# Patient Record
Sex: Female | Born: 1935
Health system: Southern US, Community
[De-identification: ages and names within clinical notes are randomized; demographics above are authoritative.]

## PROBLEM LIST (undated history)

## (undated) DIAGNOSIS — G629 Polyneuropathy, unspecified: Secondary | ICD-10-CM

## (undated) DIAGNOSIS — I350 Nonrheumatic aortic (valve) stenosis: Secondary | ICD-10-CM

## (undated) DIAGNOSIS — E079 Disorder of thyroid, unspecified: Secondary | ICD-10-CM

## (undated) DIAGNOSIS — E785 Hyperlipidemia, unspecified: Secondary | ICD-10-CM

## (undated) DIAGNOSIS — G459 Transient cerebral ischemic attack, unspecified: Secondary | ICD-10-CM

## (undated) DIAGNOSIS — I10 Essential (primary) hypertension: Secondary | ICD-10-CM

## (undated) DIAGNOSIS — I2510 Atherosclerotic heart disease of native coronary artery without angina pectoris: Secondary | ICD-10-CM

## (undated) DIAGNOSIS — M1990 Unspecified osteoarthritis, unspecified site: Secondary | ICD-10-CM

## (undated) HISTORY — PX: ARTERIAL BYPASS SURGRY: SHX557

## (undated) HISTORY — DX: Nonrheumatic aortic (valve) stenosis: I35.0

## (undated) HISTORY — DX: Disorder of thyroid, unspecified: E07.9

## (undated) HISTORY — PX: LIPOMA EXCISION: SHX5283

## (undated) HISTORY — PX: BACK SURGERY: SHX140

## (undated) HISTORY — DX: Essential (primary) hypertension: I10

## (undated) HISTORY — DX: Hyperlipidemia, unspecified: E78.5

## (undated) HISTORY — PX: UPPER GASTROINTESTINAL ENDOSCOPY: SHX188

## (undated) HISTORY — PX: SKULL FRACTURE ELEVATION: SHX781

## (undated) HISTORY — PX: APPENDECTOMY: SHX54

## (undated) HISTORY — DX: Unspecified osteoarthritis, unspecified site: M19.90

## (undated) HISTORY — DX: Transient cerebral ischemic attack, unspecified: G45.9

## (undated) HISTORY — DX: Polyneuropathy, unspecified: G62.9

## (undated) HISTORY — PX: COLONOSCOPY: SHX174

## (undated) HISTORY — DX: Atherosclerotic heart disease of native coronary artery without angina pectoris: I25.10

---

## 1998-01-27 ENCOUNTER — Other Ambulatory Visit: Admission: RE | Admit: 1998-01-27 | Discharge: 1998-01-27 | Payer: Self-pay | Admitting: Internal Medicine

## 1999-01-28 ENCOUNTER — Other Ambulatory Visit: Admission: RE | Admit: 1999-01-28 | Discharge: 1999-01-28 | Payer: Self-pay | Admitting: Internal Medicine

## 1999-03-19 ENCOUNTER — Ambulatory Visit (HOSPITAL_COMMUNITY): Admission: RE | Admit: 1999-03-19 | Discharge: 1999-03-19 | Payer: Self-pay | Admitting: Gastroenterology

## 1999-04-20 ENCOUNTER — Encounter: Payer: Self-pay | Admitting: Surgery

## 1999-04-22 ENCOUNTER — Encounter: Payer: Self-pay | Admitting: Surgery

## 1999-04-22 ENCOUNTER — Inpatient Hospital Stay (HOSPITAL_COMMUNITY): Admission: RE | Admit: 1999-04-22 | Discharge: 1999-04-28 | Payer: Self-pay | Admitting: Surgery

## 1999-09-14 ENCOUNTER — Encounter: Admission: RE | Admit: 1999-09-14 | Discharge: 1999-09-14 | Payer: Self-pay | Admitting: Internal Medicine

## 1999-09-14 ENCOUNTER — Encounter: Payer: Self-pay | Admitting: Internal Medicine

## 2000-02-10 ENCOUNTER — Other Ambulatory Visit: Admission: RE | Admit: 2000-02-10 | Discharge: 2000-02-10 | Payer: Self-pay | Admitting: Internal Medicine

## 2000-02-12 ENCOUNTER — Encounter: Payer: Self-pay | Admitting: Internal Medicine

## 2000-02-12 ENCOUNTER — Encounter: Admission: RE | Admit: 2000-02-12 | Discharge: 2000-02-12 | Payer: Self-pay | Admitting: Internal Medicine

## 2000-09-14 ENCOUNTER — Encounter: Payer: Self-pay | Admitting: Internal Medicine

## 2000-09-14 ENCOUNTER — Encounter: Admission: RE | Admit: 2000-09-14 | Discharge: 2000-09-14 | Payer: Self-pay | Admitting: Internal Medicine

## 2001-02-27 ENCOUNTER — Other Ambulatory Visit: Admission: RE | Admit: 2001-02-27 | Discharge: 2001-02-27 | Payer: Self-pay | Admitting: Internal Medicine

## 2001-03-07 ENCOUNTER — Encounter: Admission: RE | Admit: 2001-03-07 | Discharge: 2001-03-07 | Payer: Self-pay | Admitting: Internal Medicine

## 2001-03-07 ENCOUNTER — Encounter: Payer: Self-pay | Admitting: Internal Medicine

## 2001-09-19 ENCOUNTER — Encounter: Payer: Self-pay | Admitting: Internal Medicine

## 2001-09-19 ENCOUNTER — Encounter: Admission: RE | Admit: 2001-09-19 | Discharge: 2001-09-19 | Payer: Self-pay | Admitting: Internal Medicine

## 2001-10-26 ENCOUNTER — Encounter: Payer: Self-pay | Admitting: Thoracic Surgery (Cardiothoracic Vascular Surgery)

## 2001-10-27 ENCOUNTER — Encounter: Payer: Self-pay | Admitting: Thoracic Surgery (Cardiothoracic Vascular Surgery)

## 2001-10-27 ENCOUNTER — Inpatient Hospital Stay (HOSPITAL_COMMUNITY): Admission: AD | Admit: 2001-10-27 | Discharge: 2001-11-02 | Payer: Self-pay | Admitting: *Deleted

## 2001-10-28 ENCOUNTER — Encounter: Payer: Self-pay | Admitting: Thoracic Surgery (Cardiothoracic Vascular Surgery)

## 2001-10-29 ENCOUNTER — Encounter: Payer: Self-pay | Admitting: Thoracic Surgery (Cardiothoracic Vascular Surgery)

## 2001-10-30 ENCOUNTER — Encounter: Payer: Self-pay | Admitting: Thoracic Surgery (Cardiothoracic Vascular Surgery)

## 2001-11-28 ENCOUNTER — Encounter (HOSPITAL_COMMUNITY): Admission: RE | Admit: 2001-11-28 | Discharge: 2001-12-28 | Payer: Self-pay | Admitting: *Deleted

## 2001-12-27 ENCOUNTER — Encounter (HOSPITAL_COMMUNITY): Admission: RE | Admit: 2001-12-27 | Discharge: 2002-01-26 | Payer: Self-pay | Admitting: *Deleted

## 2002-01-29 ENCOUNTER — Encounter (HOSPITAL_COMMUNITY): Admission: RE | Admit: 2002-01-29 | Discharge: 2002-02-28 | Payer: Self-pay | Admitting: *Deleted

## 2002-02-24 ENCOUNTER — Encounter: Payer: Self-pay | Admitting: *Deleted

## 2002-02-24 ENCOUNTER — Emergency Department (HOSPITAL_COMMUNITY): Admission: EM | Admit: 2002-02-24 | Discharge: 2002-02-24 | Payer: Self-pay | Admitting: *Deleted

## 2002-03-02 ENCOUNTER — Encounter (HOSPITAL_COMMUNITY): Admission: RE | Admit: 2002-03-02 | Discharge: 2002-04-01 | Payer: Self-pay | Admitting: *Deleted

## 2002-09-25 ENCOUNTER — Encounter: Payer: Self-pay | Admitting: Internal Medicine

## 2002-09-25 ENCOUNTER — Encounter: Admission: RE | Admit: 2002-09-25 | Discharge: 2002-09-25 | Payer: Self-pay | Admitting: Internal Medicine

## 2002-11-02 ENCOUNTER — Ambulatory Visit (HOSPITAL_COMMUNITY): Admission: RE | Admit: 2002-11-02 | Discharge: 2002-11-02 | Payer: Self-pay | Admitting: *Deleted

## 2002-11-08 ENCOUNTER — Ambulatory Visit (HOSPITAL_COMMUNITY): Admission: RE | Admit: 2002-11-08 | Discharge: 2002-11-08 | Payer: Self-pay | Admitting: Cardiology

## 2003-03-18 ENCOUNTER — Encounter: Payer: Self-pay | Admitting: Internal Medicine

## 2003-03-18 ENCOUNTER — Encounter: Admission: RE | Admit: 2003-03-18 | Discharge: 2003-03-18 | Payer: Self-pay | Admitting: Internal Medicine

## 2003-03-26 ENCOUNTER — Encounter: Payer: Self-pay | Admitting: Internal Medicine

## 2003-03-26 ENCOUNTER — Encounter: Admission: RE | Admit: 2003-03-26 | Discharge: 2003-03-26 | Payer: Self-pay | Admitting: Internal Medicine

## 2003-04-19 ENCOUNTER — Ambulatory Visit (HOSPITAL_COMMUNITY): Admission: RE | Admit: 2003-04-19 | Discharge: 2003-04-19 | Payer: Self-pay | Admitting: Internal Medicine

## 2003-11-23 ENCOUNTER — Encounter: Admission: RE | Admit: 2003-11-23 | Discharge: 2003-11-23 | Payer: Self-pay | Admitting: Internal Medicine

## 2004-03-20 ENCOUNTER — Other Ambulatory Visit: Admission: RE | Admit: 2004-03-20 | Discharge: 2004-03-20 | Payer: Self-pay | Admitting: Internal Medicine

## 2005-01-11 ENCOUNTER — Encounter: Admission: RE | Admit: 2005-01-11 | Discharge: 2005-01-11 | Payer: Self-pay | Admitting: Internal Medicine

## 2005-05-25 ENCOUNTER — Encounter: Admission: RE | Admit: 2005-05-25 | Discharge: 2005-05-25 | Payer: Self-pay | Admitting: Internal Medicine

## 2005-12-01 ENCOUNTER — Ambulatory Visit: Payer: Self-pay | Admitting: Internal Medicine

## 2005-12-22 ENCOUNTER — Ambulatory Visit: Payer: Self-pay | Admitting: Internal Medicine

## 2005-12-28 ENCOUNTER — Ambulatory Visit (HOSPITAL_COMMUNITY): Admission: RE | Admit: 2005-12-28 | Discharge: 2005-12-28 | Payer: Self-pay | Admitting: Internal Medicine

## 2006-01-14 ENCOUNTER — Ambulatory Visit: Payer: Self-pay | Admitting: Internal Medicine

## 2006-01-20 ENCOUNTER — Ambulatory Visit: Payer: Self-pay | Admitting: Internal Medicine

## 2006-01-25 ENCOUNTER — Ambulatory Visit: Payer: Self-pay | Admitting: Internal Medicine

## 2006-01-25 ENCOUNTER — Encounter: Admission: RE | Admit: 2006-01-25 | Discharge: 2006-01-25 | Payer: Self-pay | Admitting: Internal Medicine

## 2006-01-31 ENCOUNTER — Ambulatory Visit: Payer: Self-pay | Admitting: Internal Medicine

## 2006-02-08 ENCOUNTER — Ambulatory Visit: Payer: Self-pay | Admitting: Internal Medicine

## 2006-05-17 ENCOUNTER — Ambulatory Visit: Payer: Self-pay | Admitting: Internal Medicine

## 2006-05-24 ENCOUNTER — Ambulatory Visit: Payer: Self-pay | Admitting: Internal Medicine

## 2006-06-07 ENCOUNTER — Ambulatory Visit: Payer: Self-pay | Admitting: Internal Medicine

## 2006-07-05 ENCOUNTER — Ambulatory Visit: Payer: Self-pay | Admitting: Internal Medicine

## 2006-10-05 ENCOUNTER — Ambulatory Visit: Payer: Self-pay | Admitting: Internal Medicine

## 2006-10-07 ENCOUNTER — Ambulatory Visit: Payer: Self-pay | Admitting: Internal Medicine

## 2006-10-12 ENCOUNTER — Ambulatory Visit (HOSPITAL_COMMUNITY): Admission: RE | Admit: 2006-10-12 | Discharge: 2006-10-12 | Payer: Self-pay | Admitting: Internal Medicine

## 2006-10-12 ENCOUNTER — Ambulatory Visit: Payer: Self-pay | Admitting: Internal Medicine

## 2006-10-19 ENCOUNTER — Ambulatory Visit: Payer: Self-pay | Admitting: Internal Medicine

## 2006-10-21 DIAGNOSIS — M545 Low back pain, unspecified: Secondary | ICD-10-CM | POA: Insufficient documentation

## 2006-10-21 DIAGNOSIS — K589 Irritable bowel syndrome without diarrhea: Secondary | ICD-10-CM | POA: Insufficient documentation

## 2006-10-21 DIAGNOSIS — J45909 Unspecified asthma, uncomplicated: Secondary | ICD-10-CM | POA: Insufficient documentation

## 2006-10-21 DIAGNOSIS — N6019 Diffuse cystic mastopathy of unspecified breast: Secondary | ICD-10-CM | POA: Insufficient documentation

## 2006-10-21 DIAGNOSIS — E785 Hyperlipidemia, unspecified: Secondary | ICD-10-CM | POA: Insufficient documentation

## 2006-10-21 DIAGNOSIS — D126 Benign neoplasm of colon, unspecified: Secondary | ICD-10-CM | POA: Insufficient documentation

## 2006-10-21 DIAGNOSIS — E039 Hypothyroidism, unspecified: Secondary | ICD-10-CM | POA: Insufficient documentation

## 2006-10-21 DIAGNOSIS — I10 Essential (primary) hypertension: Secondary | ICD-10-CM | POA: Insufficient documentation

## 2006-10-21 DIAGNOSIS — IMO0002 Reserved for concepts with insufficient information to code with codable children: Secondary | ICD-10-CM | POA: Insufficient documentation

## 2006-10-21 DIAGNOSIS — G43009 Migraine without aura, not intractable, without status migrainosus: Secondary | ICD-10-CM | POA: Insufficient documentation

## 2006-11-10 ENCOUNTER — Ambulatory Visit: Payer: Self-pay | Admitting: Family Medicine

## 2006-11-10 ENCOUNTER — Ambulatory Visit (HOSPITAL_COMMUNITY): Admission: RE | Admit: 2006-11-10 | Discharge: 2006-11-10 | Payer: Self-pay | Admitting: Internal Medicine

## 2006-11-11 ENCOUNTER — Ambulatory Visit: Payer: Self-pay | Admitting: Internal Medicine

## 2006-11-29 ENCOUNTER — Encounter (INDEPENDENT_AMBULATORY_CARE_PROVIDER_SITE_OTHER): Payer: Self-pay | Admitting: Internal Medicine

## 2006-12-02 ENCOUNTER — Ambulatory Visit: Payer: Self-pay | Admitting: Internal Medicine

## 2006-12-02 DIAGNOSIS — R51 Headache: Secondary | ICD-10-CM | POA: Insufficient documentation

## 2006-12-02 DIAGNOSIS — R519 Headache, unspecified: Secondary | ICD-10-CM | POA: Insufficient documentation

## 2006-12-02 DIAGNOSIS — H8110 Benign paroxysmal vertigo, unspecified ear: Secondary | ICD-10-CM | POA: Insufficient documentation

## 2006-12-05 ENCOUNTER — Encounter (INDEPENDENT_AMBULATORY_CARE_PROVIDER_SITE_OTHER): Payer: Self-pay | Admitting: Internal Medicine

## 2006-12-05 ENCOUNTER — Telehealth (INDEPENDENT_AMBULATORY_CARE_PROVIDER_SITE_OTHER): Payer: Self-pay | Admitting: *Deleted

## 2006-12-12 ENCOUNTER — Encounter (INDEPENDENT_AMBULATORY_CARE_PROVIDER_SITE_OTHER): Payer: Self-pay | Admitting: Internal Medicine

## 2006-12-23 ENCOUNTER — Ambulatory Visit: Payer: Self-pay | Admitting: Internal Medicine

## 2007-01-03 ENCOUNTER — Encounter (INDEPENDENT_AMBULATORY_CARE_PROVIDER_SITE_OTHER): Payer: Self-pay | Admitting: Internal Medicine

## 2007-02-21 ENCOUNTER — Encounter (INDEPENDENT_AMBULATORY_CARE_PROVIDER_SITE_OTHER): Payer: Self-pay | Admitting: Internal Medicine

## 2007-03-01 ENCOUNTER — Encounter: Admission: RE | Admit: 2007-03-01 | Discharge: 2007-03-01 | Payer: Self-pay | Admitting: Internal Medicine

## 2007-03-20 ENCOUNTER — Ambulatory Visit (HOSPITAL_COMMUNITY): Admission: RE | Admit: 2007-03-20 | Discharge: 2007-03-20 | Payer: Self-pay | Admitting: Preventative Medicine

## 2007-03-27 ENCOUNTER — Ambulatory Visit (HOSPITAL_COMMUNITY): Admission: RE | Admit: 2007-03-27 | Discharge: 2007-03-27 | Payer: Self-pay | Admitting: Internal Medicine

## 2007-03-27 ENCOUNTER — Ambulatory Visit: Payer: Self-pay | Admitting: Internal Medicine

## 2007-03-27 DIAGNOSIS — R609 Edema, unspecified: Secondary | ICD-10-CM | POA: Insufficient documentation

## 2007-03-27 DIAGNOSIS — M25579 Pain in unspecified ankle and joints of unspecified foot: Secondary | ICD-10-CM | POA: Insufficient documentation

## 2007-03-27 LAB — CONVERTED CEMR LAB
Glucose, Urine, Semiquant: NEGATIVE
Nitrite: NEGATIVE
Protein, U semiquant: NEGATIVE
pH: 5.5

## 2007-03-28 ENCOUNTER — Encounter (INDEPENDENT_AMBULATORY_CARE_PROVIDER_SITE_OTHER): Payer: Self-pay | Admitting: Internal Medicine

## 2007-03-28 LAB — CONVERTED CEMR LAB
Glucose, Bld: 73 mg/dL (ref 70–99)
Potassium: 3.9 meq/L (ref 3.5–5.3)
Sodium: 144 meq/L (ref 135–145)
TSH: 0.236 microintl units/mL — ABNORMAL LOW (ref 0.350–5.50)

## 2007-03-29 ENCOUNTER — Encounter (INDEPENDENT_AMBULATORY_CARE_PROVIDER_SITE_OTHER): Payer: Self-pay | Admitting: Internal Medicine

## 2007-03-31 LAB — CONVERTED CEMR LAB
Free T4: 1.53 ng/dL (ref 0.89–1.80)
T3, Free: 2.7 pg/mL (ref 2.3–4.2)

## 2007-04-14 ENCOUNTER — Ambulatory Visit: Payer: Self-pay | Admitting: Internal Medicine

## 2007-04-14 DIAGNOSIS — M79609 Pain in unspecified limb: Secondary | ICD-10-CM | POA: Insufficient documentation

## 2007-04-18 ENCOUNTER — Ambulatory Visit (HOSPITAL_COMMUNITY): Admission: RE | Admit: 2007-04-18 | Discharge: 2007-04-18 | Payer: Self-pay | Admitting: Internal Medicine

## 2007-04-18 ENCOUNTER — Encounter (INDEPENDENT_AMBULATORY_CARE_PROVIDER_SITE_OTHER): Payer: Self-pay | Admitting: Internal Medicine

## 2007-04-19 ENCOUNTER — Telehealth (INDEPENDENT_AMBULATORY_CARE_PROVIDER_SITE_OTHER): Payer: Self-pay | Admitting: Internal Medicine

## 2007-05-01 ENCOUNTER — Telehealth (INDEPENDENT_AMBULATORY_CARE_PROVIDER_SITE_OTHER): Payer: Self-pay | Admitting: *Deleted

## 2007-05-03 ENCOUNTER — Ambulatory Visit: Payer: Self-pay | Admitting: Internal Medicine

## 2007-05-03 DIAGNOSIS — G609 Hereditary and idiopathic neuropathy, unspecified: Secondary | ICD-10-CM | POA: Insufficient documentation

## 2007-05-03 DIAGNOSIS — R42 Dizziness and giddiness: Secondary | ICD-10-CM | POA: Insufficient documentation

## 2007-05-05 ENCOUNTER — Encounter (INDEPENDENT_AMBULATORY_CARE_PROVIDER_SITE_OTHER): Payer: Self-pay | Admitting: Internal Medicine

## 2007-05-08 ENCOUNTER — Telehealth (INDEPENDENT_AMBULATORY_CARE_PROVIDER_SITE_OTHER): Payer: Self-pay | Admitting: *Deleted

## 2007-05-09 ENCOUNTER — Telehealth (INDEPENDENT_AMBULATORY_CARE_PROVIDER_SITE_OTHER): Payer: Self-pay | Admitting: *Deleted

## 2007-05-09 LAB — CONVERTED CEMR LAB
BUN: 15 mg/dL (ref 6–23)
Calcium: 9.8 mg/dL (ref 8.4–10.5)
Glucose, Bld: 83 mg/dL (ref 70–99)
Sodium: 143 meq/L (ref 135–145)

## 2007-05-10 ENCOUNTER — Encounter (INDEPENDENT_AMBULATORY_CARE_PROVIDER_SITE_OTHER): Payer: Self-pay | Admitting: Internal Medicine

## 2007-06-27 ENCOUNTER — Encounter (INDEPENDENT_AMBULATORY_CARE_PROVIDER_SITE_OTHER): Payer: Self-pay | Admitting: Internal Medicine

## 2007-08-21 ENCOUNTER — Telehealth (INDEPENDENT_AMBULATORY_CARE_PROVIDER_SITE_OTHER): Payer: Self-pay | Admitting: Internal Medicine

## 2007-10-25 ENCOUNTER — Ambulatory Visit: Payer: Self-pay | Admitting: Internal Medicine

## 2007-10-25 DIAGNOSIS — J209 Acute bronchitis, unspecified: Secondary | ICD-10-CM | POA: Insufficient documentation

## 2007-12-26 ENCOUNTER — Encounter (INDEPENDENT_AMBULATORY_CARE_PROVIDER_SITE_OTHER): Payer: Self-pay | Admitting: Internal Medicine

## 2008-01-09 ENCOUNTER — Encounter (INDEPENDENT_AMBULATORY_CARE_PROVIDER_SITE_OTHER): Payer: Self-pay | Admitting: Cardiology

## 2008-01-09 ENCOUNTER — Ambulatory Visit: Payer: Self-pay | Admitting: Vascular Surgery

## 2008-01-09 ENCOUNTER — Ambulatory Visit (HOSPITAL_COMMUNITY): Admission: RE | Admit: 2008-01-09 | Discharge: 2008-01-09 | Payer: Self-pay | Admitting: Cardiology

## 2008-03-05 ENCOUNTER — Encounter: Admission: RE | Admit: 2008-03-05 | Discharge: 2008-03-05 | Payer: Self-pay | Admitting: Internal Medicine

## 2008-03-12 ENCOUNTER — Ambulatory Visit: Payer: Self-pay | Admitting: Internal Medicine

## 2008-03-12 DIAGNOSIS — G459 Transient cerebral ischemic attack, unspecified: Secondary | ICD-10-CM | POA: Insufficient documentation

## 2008-03-12 DIAGNOSIS — M25559 Pain in unspecified hip: Secondary | ICD-10-CM | POA: Insufficient documentation

## 2008-03-13 LAB — CONVERTED CEMR LAB
BUN: 16 mg/dL (ref 6–23)
Chloride: 110 meq/L (ref 96–112)
Potassium: 3.6 meq/L (ref 3.5–5.3)
Sodium: 142 meq/L (ref 135–145)
TSH: 0.061 microintl units/mL — ABNORMAL LOW (ref 0.350–5.50)

## 2008-04-08 ENCOUNTER — Telehealth (INDEPENDENT_AMBULATORY_CARE_PROVIDER_SITE_OTHER): Payer: Self-pay | Admitting: Internal Medicine

## 2008-04-26 ENCOUNTER — Encounter (INDEPENDENT_AMBULATORY_CARE_PROVIDER_SITE_OTHER): Payer: Self-pay | Admitting: Internal Medicine

## 2008-10-21 ENCOUNTER — Ambulatory Visit (HOSPITAL_COMMUNITY): Admission: RE | Admit: 2008-10-21 | Discharge: 2008-10-21 | Payer: Self-pay | Admitting: Internal Medicine

## 2008-11-25 ENCOUNTER — Ambulatory Visit (HOSPITAL_COMMUNITY): Admission: RE | Admit: 2008-11-25 | Discharge: 2008-11-25 | Payer: Self-pay | Admitting: Internal Medicine

## 2008-11-27 ENCOUNTER — Emergency Department (HOSPITAL_COMMUNITY): Admission: EM | Admit: 2008-11-27 | Discharge: 2008-11-27 | Payer: Self-pay | Admitting: Emergency Medicine

## 2008-11-28 ENCOUNTER — Ambulatory Visit (HOSPITAL_COMMUNITY): Admission: RE | Admit: 2008-11-28 | Discharge: 2008-11-28 | Payer: Self-pay | Admitting: Emergency Medicine

## 2009-03-11 ENCOUNTER — Encounter: Admission: RE | Admit: 2009-03-11 | Discharge: 2009-03-11 | Payer: Self-pay | Admitting: Internal Medicine

## 2009-05-06 ENCOUNTER — Ambulatory Visit (HOSPITAL_COMMUNITY): Admission: RE | Admit: 2009-05-06 | Discharge: 2009-05-06 | Payer: Self-pay | Admitting: Internal Medicine

## 2010-03-19 ENCOUNTER — Ambulatory Visit (HOSPITAL_COMMUNITY)
Admission: RE | Admit: 2010-03-19 | Discharge: 2010-03-19 | Payer: Self-pay | Source: Home / Self Care | Admitting: Internal Medicine

## 2010-10-29 ENCOUNTER — Ambulatory Visit (HOSPITAL_COMMUNITY)
Admission: RE | Admit: 2010-10-29 | Discharge: 2010-10-29 | Payer: Self-pay | Source: Home / Self Care | Attending: Internal Medicine | Admitting: Internal Medicine

## 2010-11-08 ENCOUNTER — Encounter: Payer: Self-pay | Admitting: Internal Medicine

## 2010-11-09 ENCOUNTER — Encounter: Payer: Self-pay | Admitting: Internal Medicine

## 2010-11-17 NOTE — Consult Note (Signed)
Summary: [facility]   [facility]   Imported By: Pablo Lawrence 05/04/2007 13:45:33  _____________________________________________________________________  External Attachment:    Type:   Image     Comment:   External Document

## 2011-02-02 LAB — CBC
MCHC: 35.3 g/dL (ref 30.0–36.0)
MCV: 97.4 fL (ref 78.0–100.0)
Platelets: 182 10*3/uL (ref 150–400)
WBC: 7.3 10*3/uL (ref 4.0–10.5)

## 2011-02-02 LAB — DIFFERENTIAL
Basophils Absolute: 0 10*3/uL (ref 0.0–0.1)
Basophils Relative: 1 % (ref 0–1)
Eosinophils Absolute: 0.1 10*3/uL (ref 0.0–0.7)
Lymphs Abs: 2.1 10*3/uL (ref 0.7–4.0)
Monocytes Relative: 7 % (ref 3–12)
Neutro Abs: 4.5 10*3/uL (ref 1.7–7.7)

## 2011-02-02 LAB — BASIC METABOLIC PANEL
BUN: 17 mg/dL (ref 6–23)
Creatinine, Ser: 0.72 mg/dL (ref 0.4–1.2)
GFR calc Af Amer: >60 mL/min (ref >60)
GFR calc non Af Amer: >60 mL/min (ref >60)
Potassium: 3.9 mEq/L (ref 3.5–5.1)
Sodium: 144 mEq/L (ref 135–145)

## 2011-02-02 LAB — APTT: aPTT: 27 seconds (ref 24–37)

## 2011-02-02 LAB — PROTIME-INR: INR: 1 (ref 0.00–1.49)

## 2011-03-05 NOTE — Op Note (Signed)
NAME:  Christine Juarez, Christine Juarez                           ACCOUNT NO.:  [id number]   MEDICAL RECORD NO.:  [id number]                   PATIENT TYPE:  AMB   LOCATION:  DAY                                  FACILITY:  [facility]   PHYSICIAN:  Lionel December, M.D.                 DATE OF BIRTH:  04-28-1920   DATE OF PROCEDURE:  04/19/2003  DATE OF DISCHARGE:                                 OPERATIVE REPORT   PROCEDURE:  Total colonoscopy.   INDICATIONS:  Ms. Michelsen is an 77 year old Caucasian female with a personal  history of colonic polyps.  Her last colonoscopy was three years ago.  She  now presents with diarrhea and has heme-positive stools.  She is undergoing  a diagnostic/surveillance colonoscopy.  The patient's family history is also  positive for colon carcinoma.  The procedure was reviewed with the patient.  Informed consent is obtained.   PREMEDICATION:  Demerol 30 mg IV, Versed 4 mg IV.   FINDINGS:  Procedure performed in endoscopy suite.  The patient's vital  signs and O2 saturation were monitored during the procedure and remained  stable.  The patient was placed in the left lateral recumbent position and  rectal examination performed.  No abnormality noted on external or digital  exam.  The Olympus video duodenoscope was placed in the distal rectum and  advanced to the sigmoid colon, which was very tortuous with multiple  diverticula.  Slowly and carefully the scope was passed into the splenic  flexure.  Once a loop was reduced, intubation of the cecum was easy.  The  cecum was identified by ileocecal valve.  Blind cecum was normal.  The  appendiceal orifice was identified and a picture was taken for the record.  There was a small polyp at the cecum, which was ablated via cold biopsy.  There was another small rectal polyp, which was ablated via cold biopsy.  No  other mucosal abnormalities were noted.  The anorectal junction was examined  by retroflexing the scope, and small hemorrhoids  were noted below the  dentate line.  The endoscope was straightened and withdrawn.  The patient  tolerated the procedure well.   FINAL DIAGNOSES:  1. Multiple diverticula at sigmoid colon.  2. Two small polyps that were ablated via cold biopsy, one from cecum,     another one from rectum.  The rectal polyp looked like an adenoma.  3. Small external hemorrhoids.   Suspect diarrhea may be due to irritable bowel syndrome.  Will hold off her  Lipitor, since she is convinced that the diarrhea is due to this medication.   She will continue Levbid at a half-tablet every morning.  She will start  Citrucel one tablespoonful daily.  I will be contacting the patient with  biopsy results.  Will plan to see her back in the office in four to six  weeks from now.  Lionel December, M.D.    NR/MEDQ  D:  04/19/2003  T:  04/20/2003  Job:  [id number]   cc:   Theda Sers, M.D., [facility].

## 2011-03-05 NOTE — Cardiovascular Report (Signed)
NAME:  Christine Juarez, Christine Juarez                           ACCOUNT NO.:  [id number]   MEDICAL RECORD NO.:  [id number]                   PATIENT TYPE:  OIB   LOCATION:  2899                                 FACILITY:  [facility]   PHYSICIAN:  Meade Maw, M.D.                 DATE OF BIRTH:  [birth year]   DATE OF PROCEDURE:  DATE OF DISCHARGE:                              CARDIAC CATHETERIZATION   INDICATIONS FOR PROCEDURE:  Reversal on the anteroapical region by  Cardiolite.  Atypical angina represented by indigestional pain.   DESCRIPTION OF PROCEDURE:  After obtaining written informed consent, the  patient was brought to the cardiac catheterization lab in the postabsorptive  state.  Preoperative sedation was achieved using IV Versed.  The right groin  was prepped and draped in the usual sterile fashion.  Local anesthesia was  achieved using 1% Xylocaine.  A 6-French hemostasis sheath was placed into  the right femoral artery using the modified Seldinger technique.  Selective  coronary angiography was performed using a JL4/JR4 Judkins catheter.  The  saphenous vein graft to the right coronary artery was engaged using a non-  torque catheter.  The LIMA was noted to be atretic, and a flush shot had to  be obtained with a JR4.  The saphenous vein graft to the diagonal was  obtained with a JR4.  All catheter exchanges were made over a guidewire.  Distal aortic root shot was made to demonstrate the occlusion of the  saphenous vein graft to the OM.  The films were then reviewed with Dr.  Amil Amen.  The patient received approximately 225 cc of contrast dye.  It was  felt that intervention should be staged in view of the large amount of  contrast received during the diagnostic workup.   FINDINGS:  1. The aortic pressure was 125/50.  2. LV pressure was 128/3, EDP of 15.  3. The patient was demonstrated to have normal wall motion, ejection     fraction of 55-60%.  There was no mitral regurgitation  noted.   CORONARY ANGIOGRAPHY:  1. The left main bifurcates into the left anterior descending and circumflex     vessel.  The left main is short.  2. Left anterior descending:  The left anterior descending gives rise to a     small diagonal.  There is a long 80-90% lesion in the proximal LAD.  The     left anterior descending itself is a small vessel measuring less than 2     mm in size.  3. Circumflex vessel:  The circumflex vessel gives rise to a small OM-1, a     moderate-sized OM-2, and an OM-3.  There are luminal irregularities in     the circumflex vessel.  4. Right coronary artery:  The right coronary artery is a large artery.  It     has a 70% proximal lesion and  a subtotal in its mid portion.  5. The left internal mammary artery is atretic but has some distal runoff.  6. The saphenous vein graft to the obtuse marginal is occluded.  7. The saphenous vein graft to the diagonal is patent; however, there is     poor distal runoff secondary to a 70% lesion in the upper limb.  8. The saphenous vein graft to the right coronary artery remains patent with     good distal runoff.   FINAL IMPRESSION:  1. Atretic left internal mammary artery to the left anterior descending     artery with severe proximal native disease.  2. Preserved left ventricular function.   These films were reviewed with Dr. Amil Amen.  He will proceed with  percutaneous revascularization in a staged procedure.                                               Meade Maw, M.D.    HP/MEDQ  D:  11/02/2002  T:  11/02/2002  Job:  [id number]   cc:   Lilla Shook, M.D.  [address], [address]  [place]  [place]  [address]  Fax: [phone]

## 2011-03-05 NOTE — Consult Note (Signed)
NAME:  Christine Juarez, Christine Juarez                             ACCOUNT NO.:  [id number]   MEDICAL RECORD NO.:  [id number]                   PATIENT TYPE:   LOCATION:                                       FACILITY:   PHYSICIAN:  Lionel December, M.D.                 DATE OF BIRTH:  1920/10/08   DATE OF CONSULTATION:  04/15/2003  DATE OF DISCHARGE:                                   CONSULTATION   GASTROINTESTINAL CONSULTATION:   REQUESTING PHYSICIAN:  Dr. Lilla Shook with [facility]   REASON FOR CONSULTATION:  Hemoccult-positive stool and diarrhea.   HISTORY OF PRESENT ILLNESS:  The patient is an 77 year old Caucasian female,  patient of Dr. Mayra Neer who presents today for further evaluation of  the above stated symptoms.  She was last seen in our practice in September  2003 for diarrhea and abdominal pain.  The abdominal pain felt to be due to  IBS.  She did very well on hyoscyamine.  She presented today with recurrent  lower abdominal pain associated with diarrhea. She can have up to 6 stools  daily.  They start out formed, but become loose to watery.  Diarrhea is  intermittent in nature.  She has regular bowel movements in between episodes  of diarrhea.  Sometimes she feels a little constipated.  Denies any melena  or rectal bleeding.  She has had some mucous in her stools.  She uses  Imodium p.r.n.  She has not been back on the hyoscyamine for unclear  reasons. She had a rectal exam by Dr. Mayra Neer and was found to be  Hemoccult-positive according to the patient's report.  Three mail-in  Hemoccult cards, however, were negative.  She had abdominal pelvic  ultrasound, results not available to me. She had a CT guided pelvis as well  which revealed a small liver lesion thought to be due to hemangioma, stable  from April 2001 exam.  Blood work revealed normal ALT and alkaline  phosphatase at 154. She has a chronically elevated alkaline phosphatase  according to Dr.  Mayra Neer note.  Hemoglobin 14.1, hematocrit 40.1.  TSH was less than 0.1; free T4 1.85.  The patient was instructed to decrease  Synthroid dose to 100 mcg, however, she feels better taking 0.125 mg and has  continued to do so.   CURRENT MEDICATIONS:  1. Synthroid 0.125 mg daily.  2. Aspirin 81 mg daily.  3. Tylenol 500 mg p.r.n.  4. Atenolol 2.5 mg daily.  5. Imodium 2 mg p.r.n.  6. Lipitor 20 mg daily.  7. Norvasc 2.5 mg daily.  8. Imdur 30 mg daily.   ALLERGIES:  BENTYL, ERYTHROMYCIN, LODINE.   PAST MEDICAL HISTORY:  1. Coronary artery bypass graft, 4 vessels, in January 2003.  2. Two episodes of global amnesia.  3. Hypothyroidism.  4. Hypertension.  5. Migraines associated with chocolate and  aspartame consumption.  6. TIA 1992.  7. She has had 2 cardiac catheterizations since we last saw her and she     reports to be unremarkable.   PAST SURGICAL HISTORY:  1. Lipoma removed from left abdomen.  2. Cholecystectomy in 2000.  3. Appendectomy.  4. Back surgery on lumbar spine.  5. Hemangioma of the right eyelid.  6. Tonsillectomy.  7. Colonoscopy August 2001 by Dr. Karilyn Cota revealed small polyp in the cecum     which was a tubular adenoma, colonic diverticulosis predominately     involving the sigmoid colon.   FAMILY HISTORY:  Mother died of bladder cancer.  Father died of prostate  cancer. One sister has been treated for ovarian cancer.  One sister and  brother deceased secondary to colorectal cancer.   SOCIAL HISTORY:  She has been widowed twice. She has 2 daughters. She has  never used tobacco products, does not drink alcohol. She is a retired  Dietitian.   REVIEW OF SYSTEMS:  Please see HPI for GI.  GENERAL:  Denies any weight  loss.  CARDIOPULMONARY:  Denies any chest pain or shortness of breath.   PHYSICAL EXAMINATION:  VITAL SIGNS:  Weight 124. Blood pressure 128/70,  pulse 64.  GENERAL:  A pleasant, well-nourished, well-developed, Caucasian female in  no  acute distress.  SKIN:  Warm and dry.  No jaundice.  HEENT:  Conjunctivae are pink.  Sclerae anicteric. Oropharyngeal mucosa  moist and pink.  No lesions, erythema or exudate.  NECK:  No lymphadenopathy.  CHEST:  Lungs clear to auscultation.  CARDIOVASCULAR:  Cardiac exam reveals regular rate and rhythm.  Normal S1,  S2.  No murmurs, rubs, or gallops.  ABDOMEN:  Positive bowel sounds, soft, nontender, nondistended.  No  organomegaly or masses.  EXTREMITIES:  No edema.   IMPRESSION:  1. This patient has recurrent, intermittent lower abdominal pain associated     with diarrhea.  She is convinced that it is due to her Lipitor although I     feel that this is less likely.  I suspect that her symptoms were     secondary to irritable bowel syndrome.  She responded to hyoscyamine     nicely in the past.  Based on lab values, she is over treated for her     hypothyroidism.  The patient has not decreased her Synthroid dose as     recommended.  It is not clear how much as I do not suspect her symptoms     to be related to this, however.  2. In addition, she was found to be Hemoccult-positive on recent rectal     examination.  She has a history of tubular adenoma on the colonoscopy 3     years ago.  She also has a family history significant for colorectal     cancer in 2 first-degree relatives.  Based on these findings I have     recommended a colonoscopy at this time.   LABS:  She has a history of chronically elevated alkaline phosphatase  followed closely by Dr. Mayra Neer.  She also has a history of probable  hemangiomas based on recent abdominal ultrasound and CT, unchanged from  prior study in April 2001.    PLAN:  1. Colonoscopy in the near future.  2. Levbid 1/2 tablet to 1 tablet p.o. q.a.m., #31, one refill.  3. Further recommendations to follow.    Tana Coast, P.A.  Lionel December, M.D.    LL/MEDQ  D:  04/15/2003  T:  04/15/2003  Job:   [id number]   cc:   Lionel December, M.D.  [address]  [place]  [place] [address]  Fax: [phone]

## 2011-03-05 NOTE — Cardiovascular Report (Signed)
[place]. [facility]  Patient:    Christine Juarez, Christine Juarez Visit Number: [id number] MRN: [id number]          Service Type: CAT Location: 3700 3736 [id number] Attending Physician:  Mora Appl Dictated by:   Lemont Fillers Fraser Din, M.D. Proc. Date: 10/26/01 Admit Date:  10/26/2001                          Cardiac Catheterization  PROCEDURE PERFORMED: Left heart catheterization, coronary angiography.  INDICATIONS FOR PROCEDURE: Unstable angina with reversible ischemia noted at the anterior interior apical region.  DESCRIPTION OF PROCEDURE: After obtaining written informed consent, the patient was brought to the cardiac catheterization lab in the postabsorptive state.  Preoperative sedation was achieved with IV Versed.  The right groin was prepped and draped in the usual sterile fashion. Local anesthesia was achieved using 1% lidocaine. A 6 French hemostasis sheath was placed into the right femoral artery using modified Seldinger technique. Selective coronary angiography was performed using a JL4, JR4 Judkins catheter. Multiple views were obtained. All catheter exchange were made over the guide wire. The hemostasis sheath was flushed following each engagement. The left internal mammary artery was engaged using the JR4.  Following the procedure, the films were reviewed with Dr. Verdis Prime. It was felt that surgical revascularization would be the patients best option. However, if she is not felt to be a surgical candidate, he will consider her for percutaneous revascularization.  FINDINGS: The aortic pressure was 145/66, LV pressure is 138/70.  Single plane ventriculogram revealed normal wall motion with an ejection fraction of 65%. There was post PVC MR only.  CORONARY ANGIOGRAPHY: Left main coronary artery:  The left main coronary bifurcates into the left anterior descending and circumflex vessel. There was a 30% distal left main stenosis noted.  Left anterior  descending: The left anterior descending has severe calcification in its proximal vessel. There was a long lesion with increase in severity. The lesion was noted to be 70% at the proximal end and approximately 90% following the second diagonal. The left anterior descending went on to end as an apical recurrent branch.  The second diagonal is also noted to have a 60% proximal lesion.  Circumflex vessel: The circumflex vessel gives rise to a moderate OM-1, large OM-2, large OM-3 and goes on to end as an AV groove vessel. There is diffuse proximal disease up to 40% in the proximal circumflex.  Right coronary artery: The right coronary artery is a large dominant artery. There is ostial disease in the right coronary artery unable to engage the catheter fully. There was noted to be diffuse disease in the right coronary artery. There was 30% proximal lesion and an 80% long mid vessel lesion and a 50% distal lesion prior to the PDA. The left internal mammary artery is noted to be patent.  IMPRESSION: 1. Critical disease involving the proximal mid left anterior descending and    mid right coronary artery. 2. Borderline disease in the proximal circumflex and large diagonal. 3. Preserved left ventricular function.  RECOMMENDATIONS: The films were reviewed with Dr. Verdis Prime. He feels that surgical revascularization would be the patients best option. Dictated by:   Lemont Fillers Fraser Din, M.D. Attending Physician:  Mora Appl DD:  10/26/01 TD:  10/26/01 Job: [id number]

## 2011-03-05 NOTE — Discharge Summary (Signed)
[place]. [facility]  Patient:    Christine Juarez, Christine Juarez Visit Number: [id number] MRN: [id number]          Service Type: MED Location: 2000 2004 01 Attending Physician:  Tressie Stalker Dictated by:   Adair Patter, P.A. Admit Date:  10/26/2001 Disc. Date: 10/31/01   CC:         Meade Maw, M.D.   Discharge Summary  ADMITTING DIAGNOSIS:  Coronary artery disease.  SECONDARY DIAGNOSES: 1. Hypertension. 2. Postoperative confusion. 3. Hypothyroidism.  HOSPITAL PROCEDURES: 1. Cardiac catheterization. 2. Bilateral carotid Duplex. 3. Coronary artery bypass graft Doppler. 4. Coronary artery bypass graft x4.  HOSPITAL COURSE:  [name] was admitted to [facility] on October 26, 2001 secondary to known history of coronary artery disease.  She underwent cardiac catheterization on this date, which revealed she had significant coronary artery disease amenable to surgical correction.  Dr. Cornelius Moras was consulted.  On October 27, 2001 Dr. Cornelius Moras performed a coronary artery bypass graft x4 -- with left internal mammary artery anastomosed to the left anterior descending artery, saphenous vein graft to the posterior descending artery, saphenous vein graft to the circumflex artery and OM3 artery, and a saphenous vein graft to the second diagonal artery.  No complications were noted during the procedure.  Postoperatively, the patients hospital course was complicated by confusion. This was alleviated by eliminating any medications that could be contributing to this course.  Postoperatively her hemoglobin and hematocrit were stable at 10 and 29 respectively; her BUN and creatinine were 15 and 0.8 respectively. The remainder of her hospital course was uneventful; she was subsequently discharged home in stable condition on October 31, 2001.  DISCHARGE MEDICATIONS: 1. Tylenol 325 mg 1-2 tablets q.4-6h. p.r.n. pain. 2. Aspirin 325 mg one q.d. 3. Synthroid 75  mcg one q.d.  ACTIVITY:  The patient was told to avoid driving, strenuous activity, lifting heavy objects.  She was told to walk daily and to continue to use her incentive spirometer every hour.  DIET:  Low-fat, low-salt.  WOUND CARE:  The patient is told she could shower and clean her incisions with soap and water.  DISPOSITION:  Home.  FOLLOW-UP:  The patient was told to call her cardiologist, Dr. Meade Maw, for appointment in two weeks.  She was told to see Dr. Salvatore Decent. Cornelius Moras on Monday, November 20, 2001 at 9:30 a.m. at the [facility] office.  She is told to bring chest x-ray to Dr. Barry Dienes office with her. Dictated by:   Adair Patter, P.A. Attending Physician:  Tressie Stalker DD:  10/31/01 TD:  11/01/01 Job: [id number]

## 2011-03-05 NOTE — Cardiovascular Report (Signed)
NAME:  Christine Juarez, Christine Juarez                           ACCOUNT NO.:  [id number]   MEDICAL RECORD NO.:  [id number]                   PATIENT TYPE:  OIB   LOCATION:  2867                                 FACILITY:  [facility]   PHYSICIAN:  Francisca December, M.D.               DATE OF BIRTH:  Aug 29, 1920   DATE OF PROCEDURE:  11/08/2002  DATE OF DISCHARGE:  11/08/2002                              CARDIAC CATHETERIZATION   PROCEDURE PERFORMED:  1. Intracoronary injection of intracoronary nitroglycerin and verapamil via     the internal mammary artery.  2. Arterial conduit angiography.   INDICATION:  The patient is an 77 year old woman who is approximately one  year status post CABG which included an IMA to the LAD.  Last week, Dr Meade Maw completed coronary graft angiogram, performed because of the onset  of atypical angina and an abnormal Cardiolite showing anterior and apical  reversible defect.  This angiogram revealed the presence of a diffusely  narrowed IMA with competitive flow in the LAD seen.  There is also a long  proximal and mid LAD calcified irregular stenosis extending over  approximately 25 to 30 mm, which was the index lesion leading to the bypass  graft of the LAD.  She is returned to the catheterization laboratory today  with the initial plan to revascularize the LAD by stenting the mid and  proximal lesion.  However, before proceeding, I will cannulate the IMA and  administer vasodilators in an attempt to possibly reverse chronic arterial  spasm.   PROCEDURAL NOTE:  The patient was brought to the cardiac catheterization  laboratory in a post-absorptive state.  The right groin was prepped and  draped in the usual sterile fashion.  Local anesthesia was obtained with the  infiltration of 1% lidocaine.  A 7-French catheter sheath was inserted  percutaneously into the right femoral artery utilizing and anterior approach  over a guiding J wire.  A 100-cm 6-French IMA catheter was  advanced to the  aortic arch and then manipulated into the left subclavian artery.  The IMA  was cannulated but not quite selectively.  The patient was then administered  0.2 mg of arterial nitroglycerin and 0.2 mg of intra-arterial verapamil.  Cineangiography was then performed in AP, near left lateral and RAO cranial  angulations.  The images were then reviewed and the situation discussed with  the patient.  The decision was made to not proceed with percutaneous  intervention at this time.   HEMODYNAMICS:  Systemic arterial pressure was 174/70 with a mean of 110  mmHg.   ANGIOGRAPHY:  The IMA to the LAD is widely patent and was approximately 1.5  to 2.0 mm in diameter following the infusion of the vasodilators as  mentioned above.  The IMA inserts upon the mid-to-distal LAD.  The graft  provides antegrade and retrograde filling; this was best seen in the left  lateral view.  There are no anastomotic lesions and no focal lesions within  the IMA graft itself.  There are no distal LAD lesions.   FINAL IMPRESSION:  1. Diffuse chronic internal mammary artery spasm with subsequent inadequate     distal perfusion of the left anterior descending territory.  2. Long, diffusely narrowed, calcified lesion in the proximal and mid left     anterior descending as seen on the angiogram performed November 02, 2002     by Dr. Candace Cruise.   PLAN:  Percutaneous intervention will be deferred.  The patient will be  started on p.o. amlodipine and isosorbide mononitrate 2.5 and 30 mg,  respectively, p.o. daily.  Followup will be in the office in the near  future.  If she is  unable to tolerate these medications because of adverse side-effects or if  she has persistent and recurrent anterior chest discomfort as she has  described previously, we will then proceed with percutaneous intervention  and long drug-eluting stent implantation in the proximal and mid anterior  descending arteries.                                                 Francisca December, M.D.    JHE/MEDQ  D:  11/08/2002  T:  11/09/2002  Job:  [id number]   cc:   Meade Maw, M.D.  [address]  [place]  [place] [address]  Fax: [phone]   [facility]

## 2011-03-05 NOTE — Op Note (Signed)
[place]  Patient:    Christine Juarez, Christine Juarez Visit Number: [id number] MRN: [id number]          Service Type: MED Location: 2300 2301 01 Attending Physician:  Tressie Stalker Dictated by:   Salvatore Decent. Cornelius Moras, M.D. Proc. Date: 10/27/01 Admit Date:  10/26/2001   CC:         Meade Maw, M.D.  Erline Levine, M.D.  CVTS Office   Operative Report  PREOPERATIVE DIAGNOSIS:  Severe 3-vessel coronary artery disease with class 3 new onset angina.  POSTOPERATIVE DIAGNOSIS:  Severe 3-vessel coronary artery disease with class 3 new onset angina.  PROCEDURE:  Median sternotomy for coronary artery bypass grafting x 4 (left internal mammary artery to distal left anterior descending coronary artery, saphenous vein graft to second diagonal branch, saphenous vein graft to third circumflex marginal branch, saphenous vein graft to posterior descending coronary artery).  SURGEON:  Salvatore Decent. Cornelius Moras, M.D.  ASSISTANT:  Salvatore Decent. Dorris Fetch, M.D.  SECOND ASSISTANT:  Adair Patter, P.A.  ANESTHESIA:  General.  BRIEF CLINICAL NOTE:  The patient is a 77 year old white female from [place], [place] followed by Dr. Erline Levine and referred by Dr. Meade Maw for management of coronary artery disease.  The patient presented with a 75-month history of new onset exertional angina and a positive stress Cardiolite examination, which was performed in December 2002.  Cardiac catheterization performed by Dr. Fraser Din demonstrated severe 3-vessel coronary artery disease with normal left ventricular function.  A full consultation note has been dictated previously.  The patient and her family understand and accept all associated risks of surgery and desired to proceed as described.  OPERATIVE NOTE IN DETAIL:  The patient was brought to the operating room on the above mentioned date and placed in the supine position.  Central hemodynamic monitoring was established by  the anesthesia service under the care and direction of Dr. Burna Forts, M.D.  Specifically, a Swan-Ganz catheter was placed through the right internal jugular approach.  A right radial arterial line was placed.  Intravenous antibiotics were administered. The patient was placed in a supine position on the operating table.  Following induction with general endotracheal anesthesia, a Foley catheter was placed. The patients chest, abdomen, both groins and both lower extremities were prepared and draped in a sterile manner.  A median sternotomy incision was performed and the left internal mammary artery was dissected from the chest wall and prepared for bypass grafting. The left internal mammary artery was notably good quality conduit. Simultaneously, saphenous vein was obtained from the patients left thigh and the upper portion of the left lower leg through a series of longitudinal incisions.  The saphenous vein was notably good quality conduit.  The patient was heparinized systemically.  The pericardium was opened.  The ascending aorta was normal in appearance. The ascending aorta and the right atrium were cannulated for cardiopulmonary bypass.  Adequate heparinization was verified.  Cardiopulmonary bypass was begun and the surface of the heart was inspected.  Distal sites were selected for coronary bypass grafting.  The left ventricle was normal in appearance with no significant hypertrophy or dilatation.  A cardioplegia catheter was placed in the ascending aorta and a temperature probe was placed in the left ventricular septum.  The patient was cooled to 32 degrees systemic temperature.  The aortic cross-clamp was applied and cardioplegia was delivered in an antegrade fashion through the aortic root.  Iced saline slush was applied for topical hypothermia.  The  initial cardioplegic arrest and myocardial cooling were felt to be excellent.  Repeat doses of cardioplegia were  administered intermittently throughout the cross-clamp portion of the operation both through the aortic root and down subsequently placed vein grafts to maintain septal temperature below 15 degrees Centigrade.  The following distal coronary anastomoses were performed:  #1 - The posterior descending coronary artery was grafted with a saphenous vein graft in an end-to-side fashion.  This coronary measured 1.5 mm in diameter and was of fair quality.  #2 - The third circumflex marginal branch was grafted with a saphenous vein graft in an end-to-side fashion.  This coronary measured 1.6 mm in diameter and was of good quality.  #3 - The second diagonal branch off the left anterior descending coronary artery was grafted with a saphenous vein graft in an end-to-side fashion. This coronary measured 1.4 mm in diameter and was of fair quality.  #4 - The distal left anterior descending coronary artery was grafted with the left internal mammary artery in an end-to-side fashion.  This coronary measured 1.6 mm in diameter and was of good quality.  All three proximal saphenous vein anastomoses were performed directly to the ascending aorta prior to removal of the aortic cross-clamp.  The septal temperature was noted to rise rapidly and dramatically upon reperfusion of the left internal mammary artery.  The aortic cross-clamp was removed after a total cross-clamp time of 53 minutes.  The heart began to beat spontaneously without need for cardioversion.  All proximal and distal anastomoses were inspected for hemostasis and appropriate graft orientation. Epicardial pacing wires were affixed to the right ventricular outflow tract and to the right atrial appendage.   The patient was weaned from cardiopulmonary bypass without difficulty.  The patients rhythm  at separation from bypass was sinus bradycardia with first degree heart-block such that A-V sequential pacing was employed.  No inotropic agents  were necessary.  Total cardiopulmonary bypass time for the operation was 78 minutes.  Normal sinus rhythm resumed spontaneously and atrial pacing was employed for rate control only.  The venous and arterial cannulae were removed uneventfully.  Protamine was administered to reverse the anticoagulation.  The mediastinum and the left chest were irrigated with saline solution containing vancomycin.  Meticulous surgical hemostasis was ascertained.  The mediastinum and the left chest were drained with three chest tubes placed through separate stab incisions inferiorly.  The deep subcutaneous tissues of the left thigh were drained with a 19-French Blake drain.  The median sternotomy incision was closed in a routine fashion and the left lower extremity incisions were closed in multiple layers in routine fashion.  All skin incisions were closed with subcuticular skin closures.  The patient tolerated the procedure well and was transported to the surgical intensive care unit in stable condition.  There were no intraoperative complications.  All sponge, instrument and needle counts were verified correct at completion of the operation.  No blood products were administered. Dictated by:   Salvatore Decent Cornelius Moras, M.D. Attending Physician:  Tressie Stalker DD:  10/27/01 TD:  10/29/01 Job: [id number]

## 2011-03-05 NOTE — Consult Note (Signed)
[place]  Patient:    Christine Juarez, Christine Juarez Visit Number: [id number] MRN: [id number]          Service Type: MED Location: 2300 2301 01 Attending Physician:  Tressie Stalker Dictated by:   Salvatore Decent. Cornelius Moras, M.D. Proc. Date: 10/26/01 Admit Date:  10/26/2001   CC:         Meade Maw, M.D.  Lilla Shook, M.D.   Consultation Report  REQUESTING PHYSICIAN:  Meade Maw, M.D.  PRIMARY CARE PHYSICIAN:  Lilla Shook, M.D.  REASON FOR CONSULTATION:  Severe three-vessel coronary artery disease with new onset angina.  HISTORY OF PRESENT ILLNESS:  The patient is a 76 year old widowed white female from [place], [place] with no previous cardiac history but risk factors including history of hypertension and hyperlipidemia.  She describes new onset symptoms of exertional angina dating back to October 2002.  Her pains had progressed somewhat and are now associated with exertional fatigue and shortness of breath.  They are also brought on with exertion such as walking up stairs.  She had one episode of chest pain while she was sitting down and bending over recently.  She has also had some presyncopal symptoms with dizzy spells.  She apparently had a Holter monitor placed and did not have any events recorded.  Her presyncopal symptoms allegedly improved following adjustment of her antihypertensive medications.  She underwent a stress Cardiolite exam on December 31, which was abnormal and notable for decreased uptake, consistent with ischemia in the anteroapical region.  She was brought in for elective cardiac catheterization today, which was performed by Dr. Fraser Din.  This demonstrated severe three-vessel coronary artery disease with normal left ventricular function.  Cardiac surgical consultation is requested.  REVIEW OF SYSTEMS:  CARDIAC:  The patient reports symptoms of angina, as described previously.  She denies any episodes of  resting shortness of breath. She has had some mild to moderate exertional shortness of breath.  She denies any history of orthopnea or extremity edema, palpitations, or syncope.  She has had presyncopal symptoms.  GENERAL:  The patient reports no problems with recent change in appetite, weight gain, or weight loss.  She has had worsening exertional fatigue.  RESPIRATORY:  Negative and notable for the absence of any productive cough, wheezing, or hemoptysis.  GASTROINTESTINAL:  Notable only for occasional indigestion.  She otherwise has no problems with appetite, digestion, or bowel function.  She denies any history of hematochezia, hematemesis, or melena.  NEUROLOGIC:  Notable for the absence of any history of transient monocular blindness or transient numbness or weakness involving either upper or lower extremity.  She apparently had two episodes of global amnesia in the distant past, which were both attributed to medication she was taking.  MUSCULOSKELETAL:  Notable for absence of problems with severe arthritis.  She does have some mild chronic pain in both knees.  She has occasional leg cramps but denies any symptoms of claudication.  GENITOURINARY: Negative and notable for the absence of any urinary frequency or dysuria. INFECTIOUS:  Negative and notable for the absence of any recent fevers or chills.  HEMATOLOGICAL:  Negative and notable for the absence of any problems with bleeding diathesis, frequent epistaxis, or easy bruising.  ENDOCRINE: Negative.  PSYCHIATRIC:  Notable for some history of mild anxiety and depression.  HEENT:  Notable for occasional problems with sinuses.  She wears eyeglasses and is mildly hard of hearing.  PAST MEDICAL HISTORY:  As stated previously and notable for  hypertension, hyperlipidemia, chronic low-back pain, and history of elevated serum alkaline phosphatase.  She underwent partial gastrectomy and cholecystectomy for removal of a benign gastric tumor  performed by Dr. Jamey Ripa in July 2000.  She underwent lumbar laminectomy and diskectomy in 1990.  She has undergone appendectomy in the remote past, as well as tonsillectomy.  FAMILY HISTORY:  Notable for the absence of any early onset coronary artery disease.  SOCIAL HISTORY:  The patient is widowed and lives with one of her sons.  She is very active and busy physically and has not been limited prior to her recent problems with symptoms of angina.  She is a nonsmoker and denies any history of significant alcohol consumption.  MEDICATIONS PRIOR TO ADMISSION: 1. Tiazac 180 mg p.o. once daily. 2. Synthroid 0.075 mg p.o. once daily. 3. Aspirin 81 mg once daily. 4. She uses Claritin occasionally for sinus trouble. 5. She has recently been started on Imdur.  ALLERGIES: 1. BENTYL. 2. ERYTHROMYCIN. 3. ANTIVERT. 4. CODEINE.  PHYSICAL EXAMINATION:  GENERAL:  A pleasant white female who appears somewhat younger than her stated age in no acute distress.  VITAL SIGNS:  Blood pressure 140/70, pulse 70, and normal sinus rhythm by telemetry monitor.  She is afebrile.  HEENT:  Essentially within normal limits.  NECK:  Supple.  There is no cervical or supraclavicular lymphadenopathy. There is no jugular venous distention.  CHEST:  Auscultation of the chest reveals clear and symmetrical breath sounds bilaterally.  No wheezes or rhonchi are noted.  CARDIOVASCULAR:  Regular rate and rhythm.  No murmurs, rubs, or gallops are noted.  ABDOMEN:  Soft and nontender.  There are no palpable masses.  The liver edge is not enlarged.  EXTREMITIES:  Warm and well perfused.  There is no lower extremity edema. Distal pulses are palpable and symmetrical in both lower legs at the ankles. There is no venous insufficiency.  RECTAL/GENITOURINARY:  Exams are both deferred.  NEUROLOGIC:  Grossly nonfocal and symmetrical throughout.   The remainder of her physical exam is unrevealing.  DIAGNOSTIC  TESTS:  Cardiac catheterization performed today by Dr. Fraser Din demonstrates severe three-vessel coronary artery disease with normal left ventricular function.  Specifically, there is long, diffuse, calcified stenosis involving the proximal and mid left anterior descending coronary artery with 70% proximal long stenosis and 90% lesion in the mid portion of this vessel.  There is 80% proximal stenosis of a large diagonal branch. There is approximately 50% proximal stenosis of the left circumflex coronary artery.  There is 80% stenosis of the mid right coronary artery and 70% stenosis of the mid posterior descending coronary artery.  Left ventricular function is normal with ejection fraction estimated greater than 65%.  IMPRESSION:  Severe three-vessel coronary artery disease with class III angina and normal left ventricular function.  I believe that Christine Juarez would benefit from elective coronary artery bypass grafting.  PLAN:  We tentatively plan to proceed with surgery tomorrow, second case.   I have outlined at length the indications and potential benefits of coronary artery bypass grafting with Christine Juarez and her family.  All of their questions have been addressed.  They understand and accept all associated risks of surgery including but not limited to risk of death, stroke, myocardial infarction, bleeding requiring blood transfusion, arrhythmia, infection, and recurrent coronary artery disease.  All of their questions have been answered. Dictated by:   Salvatore Decent Cornelius Moras, M.D. Attending Physician:  Tressie Stalker DD:  10/26/01 TD:  10/28/01 Job: [phone]  [id number]

## 2011-04-06 ENCOUNTER — Other Ambulatory Visit (HOSPITAL_COMMUNITY): Payer: Self-pay | Admitting: Internal Medicine

## 2011-04-06 DIAGNOSIS — Z139 Encounter for screening, unspecified: Secondary | ICD-10-CM

## 2011-04-08 ENCOUNTER — Ambulatory Visit (HOSPITAL_COMMUNITY)
Admission: RE | Admit: 2011-04-08 | Discharge: 2011-04-08 | Disposition: A | Discharge status: Home / Self Care | Payer: Medicare Other | Source: Ambulatory Visit | Attending: Internal Medicine | Admitting: Internal Medicine

## 2011-04-08 DIAGNOSIS — Z139 Encounter for screening, unspecified: Secondary | ICD-10-CM

## 2011-04-08 DIAGNOSIS — Z1231 Encounter for screening mammogram for malignant neoplasm of breast: Secondary | ICD-10-CM | POA: Insufficient documentation

## 2011-07-07 ENCOUNTER — Ambulatory Visit (HOSPITAL_COMMUNITY)
Admission: RE | Admit: 2011-07-07 | Discharge: 2011-07-07 | Disposition: A | Discharge status: Home / Self Care | Payer: Medicare Other | Source: Ambulatory Visit | Attending: Internal Medicine | Admitting: Internal Medicine

## 2011-07-07 ENCOUNTER — Other Ambulatory Visit (HOSPITAL_COMMUNITY): Payer: Self-pay | Admitting: Internal Medicine

## 2011-07-07 DIAGNOSIS — R52 Pain, unspecified: Secondary | ICD-10-CM

## 2011-07-07 DIAGNOSIS — M25619 Stiffness of unspecified shoulder, not elsewhere classified: Secondary | ICD-10-CM | POA: Insufficient documentation

## 2011-07-07 DIAGNOSIS — M75100 Unspecified rotator cuff tear or rupture of unspecified shoulder, not specified as traumatic: Secondary | ICD-10-CM | POA: Insufficient documentation

## 2011-07-07 DIAGNOSIS — M249 Joint derangement, unspecified: Secondary | ICD-10-CM | POA: Insufficient documentation

## 2011-07-07 DIAGNOSIS — M25519 Pain in unspecified shoulder: Secondary | ICD-10-CM | POA: Insufficient documentation

## 2011-07-07 DIAGNOSIS — IMO0002 Reserved for concepts with insufficient information to code with codable children: Secondary | ICD-10-CM | POA: Insufficient documentation

## 2011-08-03 ENCOUNTER — Ambulatory Visit (INDEPENDENT_AMBULATORY_CARE_PROVIDER_SITE_OTHER): Payer: Medicare Other | Admitting: Orthopedic Surgery

## 2011-08-03 ENCOUNTER — Encounter: Payer: Self-pay | Admitting: Orthopedic Surgery

## 2011-08-03 VITALS — BP 120/58 | Ht 62.0 in | Wt 133.0 lb

## 2011-08-03 DIAGNOSIS — M67919 Unspecified disorder of synovium and tendon, unspecified shoulder: Secondary | ICD-10-CM

## 2011-08-03 DIAGNOSIS — M719 Bursopathy, unspecified: Secondary | ICD-10-CM

## 2011-08-03 DIAGNOSIS — S43429A Sprain of unspecified rotator cuff capsule, initial encounter: Secondary | ICD-10-CM

## 2011-08-03 DIAGNOSIS — M75101 Unspecified rotator cuff tear or rupture of right shoulder, not specified as traumatic: Secondary | ICD-10-CM | POA: Insufficient documentation

## 2011-08-03 NOTE — Progress Notes (Signed)
RIGHT shoulder pain  New patient  Dr. Margo Aye referring  Pain since February of 2012 no trauma gradual onset complains of dull 5/10 pain relieved partially by Aspercreme it's worse in the morning  MRI has been done shows a partial tear with bursitis  Review of systems negative except for some diarrhea and some discomfort in her LEFT foot and LEFT leg history of previous back surgery  Past Medical History  Diagnosis Date  . Arthritis   . Neuropathy   . Heart disease   . Hyperlipidemia   . Thyroid disease   . HTN (hypertension)     Gen. Exam is normal  Pulse and perfusion are normal and the RIGHT upper extremity there is no lymph nodes skins intact she is awake alert and oriented x3 mood and affect are normal  She walks without assistive device   tenderness in the glenohumeral area over the anterolateral acromion and posterior acromial soft spot  She has active range of motion 90 she has a negative drop arm test positive impingement sign weakness in the supraspinatus with a stable shoulder  I reviewed her MRI and the report I agree that there is a partial undersurface tear of the rotator cuff supraspinatus with bursitis.  We discussed treatment options.  She would like to try nonoperative treatment I agree based on her age and heart condition  We inject the subacromial space and referred her to therapy  Injection subacromial space RIGHT shoulder Shoulder Injection Procedure Note   Pre-operative Diagnosis: right  RC Syndrome  Post-operative Diagnosis: same  Indications: pain   Anesthesia: ethyl chloride   Procedure Details   Verbal consent was obtained for the procedure. The shoulder was prepped withalcohol and the skin was anesthetized. A 20 gauge needle was advanced into the subacromial space through posterior approach without difficulty  The space was then injected with 3 ml 1% lidocaine and 1 ml of depomedrol. The injection site was cleansed with isopropyl alcohol  and a dressing was applied.  Complications:  None; patient tolerated the procedure well.

## 2011-08-03 NOTE — Patient Instructions (Signed)
You have received a steroid shot. 15% of patients experience increased pain at the injection site with in the next 24 hours. This is best treated with ice and tylenol extra strength 2 tabs every 8 hours. If you are still having pain please call the office.    Start therapy

## 2011-08-13 ENCOUNTER — Ambulatory Visit (HOSPITAL_COMMUNITY)
Admission: RE | Admit: 2011-08-13 | Discharge: 2011-08-13 | Disposition: A | Discharge status: Home / Self Care | Payer: Medicare Other | Source: Ambulatory Visit | Attending: Orthopedic Surgery | Admitting: Orthopedic Surgery

## 2011-08-13 DIAGNOSIS — M25519 Pain in unspecified shoulder: Secondary | ICD-10-CM | POA: Insufficient documentation

## 2011-08-13 DIAGNOSIS — M75101 Unspecified rotator cuff tear or rupture of right shoulder, not specified as traumatic: Secondary | ICD-10-CM

## 2011-08-13 DIAGNOSIS — M25619 Stiffness of unspecified shoulder, not elsewhere classified: Secondary | ICD-10-CM | POA: Insufficient documentation

## 2011-08-13 DIAGNOSIS — E785 Hyperlipidemia, unspecified: Secondary | ICD-10-CM | POA: Insufficient documentation

## 2011-08-13 DIAGNOSIS — M6281 Muscle weakness (generalized): Secondary | ICD-10-CM | POA: Insufficient documentation

## 2011-08-13 DIAGNOSIS — IMO0001 Reserved for inherently not codable concepts without codable children: Secondary | ICD-10-CM | POA: Insufficient documentation

## 2011-08-13 DIAGNOSIS — I10 Essential (primary) hypertension: Secondary | ICD-10-CM | POA: Insufficient documentation

## 2011-08-13 NOTE — Progress Notes (Signed)
Occupational Therapy Evaluation  Patient Details  Name: Christine Juarez MRN: [id number] Date of Birth: [birth year]  Today's Date: 08/13/2011 Time: 1191-4782 Time Calculation (min): 41 min OT Evaluation 41' Visit#: 1  of 1   Re-eval:    Assessment Diagnosis: Right Partial Rotator Cuff Tear - Dr. Romeo Apple Prior Therapy: no  Past Medical History:  Past Medical History  Diagnosis Date  . Arthritis   . Neuropathy   . Heart disease   . Hyperlipidemia   . Thyroid disease   . HTN (hypertension)    Past Surgical History:  Past Surgical History  Procedure Date  . Skull fracture elevation   . Appendectomy   . Lipoma excision   . Arterial bypass surgry   . Back surgery     Subjective Symptoms/Limitations Symptoms: S:  I used to not be able to lift my right arm up without help, but after my injection last week, I can without a problem. Limitations: History:  [name] has had decreased mobility and increased pain in her right shoulder since February of this year, she consulted with Dr. Romeo Apple and received a cortisone injection last Tuesday.  Since recieving the injection, she has been able to lift her arm and use it with all daily activities without pain. Pain Assessment Currently in Pain?: No/denies  Precautions/Restrictions     Prior Functioning     Assessment ADL ADL Comments: no problems, right handed.   RUE Assessment RUE Assessment:  (assessed in seated.  ER and IR with shoulder abducted to 90) RUE AROM (degrees) RUE Overall AROM Comments: equal to LUE Right Shoulder Flexion  0-170: 153 Degrees Right Shoulder ABduction 0-140: 160 Degrees Right Shoulder Internal Rotation  0-70: 70 Degrees Right Shoulder External Rotation  0-90: 90 Degrees RUE Strength Right Shoulder Flexion: 5/5 Right Shoulder ABduction: 5/5 Right Shoulder Internal Rotation: 5/5 Right Shoulder External Rotation: 5/5  Exercise/Treatments    Occupational Therapy Assessment and Plan OT  Assessment and Plan Clinical Impression Statement: A:  Patient presents with 0 deficits in right shoulder after recieving cortisone injection.  No skilled OT indicated at this time.  Educated patient on HEP for scapular stability.  Instructed her to call this clinic if her symptoms returned so that skilled therapy could be initiated. Rehab Potential: Excellent OT Frequency: Min 1X/week OT Duration:  (1 week) OT Treatment/Interventions: Patient/family education (scapular stability theraband exercises) OT Plan: P:  One time OT Evaluation completed this date.  No further skilled OT intervention indicated at this time.   Goals Home Exercise Program Pt will Perform Home Exercise Program: Independently End of Session Patient Active Problem List  Diagnoses  . COLONIC POLYPS  . HYPOTHYROIDISM  . HYPERLIPIDEMIA  . COMMON MIGRAINE  . PERIPHERAL NEUROPATHY  . BENIGN POSITIONAL VERTIGO  . HYPERTENSION  . CORONARY ARTERY DISEASE  . TIA  . ASTHMATIC BRONCHITIS, ACUTE  . ASTHMA  . IRRITABLE BOWEL SYNDROME  . FIBROCYSTIC BREAST DISEASE  . HIP PAIN, BILATERAL  . ANKLE PAIN, LEFT  . DEGENERATIVE DISC DISEASE  . LOW BACK PAIN  . LEG PAIN  . DYSEQUILIBRIUM  . EDEMA LEG  . HEADACHE  . Rotator cuff syndrome of right shoulder  . Rotator cuff tear, right   End of Session Activity Tolerance: Patient tolerated treatment well General Behavior During Session: [facility] for tasks performed Cognition: [facility] for tasks performed  Time Calculation Start Time: 1339 Stop Time: 1420 Time Calculation (min): 41 min 08/13/2011, 2:26 PM Shirlean Mylar, OTR/L  Physician Documentation  Your signature is required to indicate approval of the treatment plan as stated above.  Please sign and either send electronically or make a copy of this report for your files and return this physician signed original.  Please mark one 1.__approve of plan  2. ___approve of plan with the following  conditions.   ______________________________                                                          _____________________ Physician Signature                                                                                                             Date

## 2011-09-06 ENCOUNTER — Telehealth: Payer: Self-pay | Admitting: Orthopedic Surgery

## 2011-09-06 NOTE — Telephone Encounter (Signed)
Patient called, wishes to cancel follow/up appointment for now for re-check shoulder/arm, as states she has been doing great. York Spaniel will call if any problems, if needs to need to re-schedule.

## 2011-09-28 ENCOUNTER — Ambulatory Visit: Payer: Medicare Other | Admitting: Orthopedic Surgery

## 2011-11-01 DIAGNOSIS — D1039 Benign neoplasm of other parts of mouth: Secondary | ICD-10-CM | POA: Diagnosis not present

## 2011-11-03 DIAGNOSIS — K089 Disorder of teeth and supporting structures, unspecified: Secondary | ICD-10-CM | POA: Diagnosis not present

## 2011-11-09 DIAGNOSIS — D1039 Benign neoplasm of other parts of mouth: Secondary | ICD-10-CM | POA: Diagnosis not present

## 2012-01-07 DIAGNOSIS — E7800 Pure hypercholesterolemia, unspecified: Secondary | ICD-10-CM | POA: Diagnosis not present

## 2012-04-10 DIAGNOSIS — K219 Gastro-esophageal reflux disease without esophagitis: Secondary | ICD-10-CM | POA: Diagnosis not present

## 2012-04-10 DIAGNOSIS — J069 Acute upper respiratory infection, unspecified: Secondary | ICD-10-CM | POA: Diagnosis not present

## 2012-04-10 DIAGNOSIS — R109 Unspecified abdominal pain: Secondary | ICD-10-CM | POA: Diagnosis not present

## 2012-04-10 DIAGNOSIS — M79609 Pain in unspecified limb: Secondary | ICD-10-CM | POA: Diagnosis not present

## 2012-04-10 DIAGNOSIS — M25519 Pain in unspecified shoulder: Secondary | ICD-10-CM | POA: Diagnosis not present

## 2012-04-13 ENCOUNTER — Other Ambulatory Visit (HOSPITAL_COMMUNITY): Payer: Self-pay | Admitting: Internal Medicine

## 2012-04-13 DIAGNOSIS — Z139 Encounter for screening, unspecified: Secondary | ICD-10-CM

## 2012-04-18 ENCOUNTER — Ambulatory Visit (HOSPITAL_COMMUNITY)
Admission: RE | Admit: 2012-04-18 | Discharge: 2012-04-18 | Disposition: A | Discharge status: Home / Self Care | Payer: Medicare Other | Source: Ambulatory Visit | Attending: Internal Medicine | Admitting: Internal Medicine

## 2012-04-18 DIAGNOSIS — Z1231 Encounter for screening mammogram for malignant neoplasm of breast: Secondary | ICD-10-CM | POA: Insufficient documentation

## 2012-04-18 DIAGNOSIS — Z139 Encounter for screening, unspecified: Secondary | ICD-10-CM

## 2012-05-09 ENCOUNTER — Ambulatory Visit (INDEPENDENT_AMBULATORY_CARE_PROVIDER_SITE_OTHER): Payer: Medicare Other | Admitting: Internal Medicine

## 2012-05-09 ENCOUNTER — Encounter (INDEPENDENT_AMBULATORY_CARE_PROVIDER_SITE_OTHER): Payer: Self-pay | Admitting: Internal Medicine

## 2012-05-09 VITALS — BP 106/70 | HR 68 | Temp 98.9°F | Resp 18 | Ht 60.0 in | Wt 130.9 lb

## 2012-05-09 DIAGNOSIS — R197 Diarrhea, unspecified: Secondary | ICD-10-CM | POA: Diagnosis not present

## 2012-05-09 NOTE — Progress Notes (Signed)
Presenting complaint;  Diarrhea and abdominal pain.  Subjective:  Patient is 78 year old Caucasian female who is in for scheduled visit accompanied by her daughter. Patient has kept written record of her symptoms. She states her misery began on 04/02/2012. On returning from the church she had to run to the bathroom with a strong urge and had an accident. For the next 4 days he was quite sick with frequent loose stools, flatulence and belly ache. She also noted drop in her appetite but did not have nausea vomiting or fever. She was seen by Dr. Catalina Pizza on 04/10/2012 an advice to take align. She is not taking this medication daily. She's had a few more episodes of accidents and explosive bowel movements but she is noted improvement in the last 3 days. Her appetite is coming back. She may have lost a few pounds. Her stool now is firm and not as thin caliber. She denies melena or rectal bleeding. She said minimal cramps over the last 2 days. No other member of her family had similar symptoms. She does not recall that she is taking antibiotics within the last 2-3 months.  Current Medications: Current Outpatient Prescriptions  Medication Sig Dispense Refill  . amLODipine (NORVASC) 5 MG tablet Take 5 mg by mouth daily.        Marland Kitchen aspirin 81 MG tablet Take 81 mg by mouth daily.        . Coenzyme Q10 (COQ10) 100 MG CAPS Take by mouth.        . ezetimibe (ZETIA) 10 MG tablet Take 10 mg by mouth daily.        . isosorbide mononitrate (IMDUR) 120 MG 24 hr tablet Take 120 mg by mouth daily.        Marland Kitchen levothyroxine (SYNTHROID, LEVOTHROID) 100 MCG tablet Take 100 mcg by mouth daily.        . metoprolol tartrate (LOPRESSOR) 25 MG tablet Take 25 mg by mouth daily.       . Multiple Vitamins-Minerals (OCUVITE PO) Take by mouth.        . rosuvastatin (CRESTOR) 10 MG tablet Take 10 mg by mouth once a week.          Objective: Blood pressure 106/70, pulse 68, temperature 98.9 F (37.2 C), temperature source  Oral, resp. rate 18, height 5' (1.524 m), weight 130 lb 14.4 oz (59.376 kg). Patient is alert and in no acute distress. She move from chair to examination table without any difficulty. Conjunctiva is pink. Sclera is nonicteric Oropharyngeal mucosa is normal. No neck masses or thyromegaly noted. Cardiac exam with regular rhythm normal S1 and S2. No murmur or gallop noted. Lungs are clear to auscultation. Abdomen abdomen is full. Bowel sounds are normal. Abdomen is soft and nontender. No organomegaly or masses. Rectal examination reveals guaiac-negative stool. No LE edema or clubbing noted.   Assessment:  Patient's symptom complex appears to be secondary to gastroenteritis and it appears she is on the verge of full recovery. She could have been treated with antibiotics 2 weeks ago but I do not feel that it is necessary now unless her symptoms progress.   Plan:  Take align 1 capsule by mouth daily. Yogurt one or two cups a day. Use glycerin or Dulcolax suppository for constipation but do not take laxatives by mouth. Progress report in 3 days.

## 2012-05-09 NOTE — Patient Instructions (Addendum)
Take align or equivalent 1 capsule by mouth daily for 30 days. Eat cup of yogurt daily for one month or indefinitely. Can use glycerin or Dulcolax suppository for constipation. Progress report in 3 days.

## 2012-08-09 DIAGNOSIS — Z23 Encounter for immunization: Secondary | ICD-10-CM | POA: Diagnosis not present

## 2012-09-07 DIAGNOSIS — E7800 Pure hypercholesterolemia, unspecified: Secondary | ICD-10-CM | POA: Diagnosis not present

## 2012-09-07 DIAGNOSIS — Z79899 Other long term (current) drug therapy: Secondary | ICD-10-CM | POA: Diagnosis not present

## 2012-09-07 DIAGNOSIS — I10 Essential (primary) hypertension: Secondary | ICD-10-CM | POA: Diagnosis not present

## 2012-09-07 DIAGNOSIS — K219 Gastro-esophageal reflux disease without esophagitis: Secondary | ICD-10-CM | POA: Diagnosis not present

## 2012-09-07 DIAGNOSIS — I2510 Atherosclerotic heart disease of native coronary artery without angina pectoris: Secondary | ICD-10-CM | POA: Diagnosis not present

## 2012-09-22 DIAGNOSIS — H26499 Other secondary cataract, unspecified eye: Secondary | ICD-10-CM | POA: Diagnosis not present

## 2012-10-27 DIAGNOSIS — H3530 Unspecified macular degeneration: Secondary | ICD-10-CM | POA: Diagnosis not present

## 2012-10-27 DIAGNOSIS — H26499 Other secondary cataract, unspecified eye: Secondary | ICD-10-CM | POA: Diagnosis not present

## 2012-12-04 DIAGNOSIS — J069 Acute upper respiratory infection, unspecified: Secondary | ICD-10-CM | POA: Diagnosis not present

## 2013-01-09 DIAGNOSIS — E7800 Pure hypercholesterolemia, unspecified: Secondary | ICD-10-CM | POA: Diagnosis not present

## 2013-01-09 DIAGNOSIS — Z79899 Other long term (current) drug therapy: Secondary | ICD-10-CM | POA: Diagnosis not present

## 2013-02-27 DIAGNOSIS — I495 Sick sinus syndrome: Secondary | ICD-10-CM | POA: Diagnosis not present

## 2013-02-27 DIAGNOSIS — I209 Angina pectoris, unspecified: Secondary | ICD-10-CM | POA: Diagnosis not present

## 2013-02-27 DIAGNOSIS — I2510 Atherosclerotic heart disease of native coronary artery without angina pectoris: Secondary | ICD-10-CM | POA: Diagnosis not present

## 2013-02-27 DIAGNOSIS — Z79899 Other long term (current) drug therapy: Secondary | ICD-10-CM | POA: Diagnosis not present

## 2013-02-27 DIAGNOSIS — I10 Essential (primary) hypertension: Secondary | ICD-10-CM | POA: Diagnosis not present

## 2013-02-27 DIAGNOSIS — E7800 Pure hypercholesterolemia, unspecified: Secondary | ICD-10-CM | POA: Diagnosis not present

## 2013-04-27 ENCOUNTER — Other Ambulatory Visit (HOSPITAL_COMMUNITY): Payer: Self-pay | Admitting: Internal Medicine

## 2013-04-27 DIAGNOSIS — Z139 Encounter for screening, unspecified: Secondary | ICD-10-CM

## 2013-05-01 ENCOUNTER — Ambulatory Visit (HOSPITAL_COMMUNITY)
Admission: RE | Admit: 2013-05-01 | Discharge: 2013-05-01 | Disposition: A | Discharge status: Home / Self Care | Payer: Medicare Other | Source: Ambulatory Visit | Attending: Internal Medicine | Admitting: Internal Medicine

## 2013-05-01 DIAGNOSIS — Z1231 Encounter for screening mammogram for malignant neoplasm of breast: Secondary | ICD-10-CM | POA: Insufficient documentation

## 2013-05-01 DIAGNOSIS — Z139 Encounter for screening, unspecified: Secondary | ICD-10-CM

## 2013-08-28 ENCOUNTER — Other Ambulatory Visit: Payer: Self-pay | Admitting: Cardiology

## 2013-09-08 ENCOUNTER — Encounter: Payer: Self-pay | Admitting: Cardiology

## 2013-09-11 ENCOUNTER — Encounter: Payer: Self-pay | Admitting: Cardiology

## 2013-09-11 ENCOUNTER — Ambulatory Visit (INDEPENDENT_AMBULATORY_CARE_PROVIDER_SITE_OTHER): Payer: Medicare Other | Admitting: Cardiology

## 2013-09-11 ENCOUNTER — Encounter (INDEPENDENT_AMBULATORY_CARE_PROVIDER_SITE_OTHER): Payer: Self-pay

## 2013-09-11 VITALS — BP 147/56 | HR 54 | Ht 60.0 in | Wt 129.8 lb

## 2013-09-11 DIAGNOSIS — I10 Essential (primary) hypertension: Secondary | ICD-10-CM | POA: Diagnosis not present

## 2013-09-11 DIAGNOSIS — E785 Hyperlipidemia, unspecified: Secondary | ICD-10-CM | POA: Diagnosis not present

## 2013-09-11 DIAGNOSIS — I2510 Atherosclerotic heart disease of native coronary artery without angina pectoris: Secondary | ICD-10-CM | POA: Diagnosis not present

## 2013-09-11 NOTE — Patient Instructions (Signed)
Your physician recommends that you continue on your current medications as directed. Please refer to the Current Medication list given to you today.  Your physician wants you to follow-up in: 1 Year with Dr Turner You will receive a reminder letter in the mail two months in advance. If you don't receive a letter, please call our office to schedule the follow-up appointment.  

## 2013-09-11 NOTE — Progress Notes (Signed)
[address] [place], [place]  [address] Phone: [phone] Fax:  [phone]  Date:  09/11/2013   ID:  JERRIANN SCHROM, DOB September 26, 1920, MRN [id number]  PCP:  Catalina Pizza, MD  Cardiologist:  Armanda Magic, MD     History of Present Illness: Christine Juarez is a 79 y.o. female with a history of ASCAD, HTN, and dyslipidemia who presents today for followup.  She is doing well.  She denies any chest pain, SOB, DOE, LE edema, dizziness, palpitations or syncope.   Wt Readings from Last 3 Encounters:  09/11/13 129 lb 12.8 oz (58.877 kg)  05/09/12 130 lb 14.4 oz (59.376 kg)  08/03/11 133 lb (60.328 kg)     Past Medical History  Diagnosis Date  . Arthritis   . Neuropathy   . Heart disease   . Hyperlipidemia   . Thyroid disease   . HTN (hypertension)     Current Outpatient Prescriptions  Medication Sig Dispense Refill  . aspirin 81 MG tablet Take 81 mg by mouth daily.        . Coenzyme Q10 (COQ10) 100 MG CAPS Take by mouth.        . ezetimibe (ZETIA) 10 MG tablet Take 10 mg by mouth daily.        . isosorbide mononitrate (IMDUR) 120 MG 24 hr tablet Take 120 mg by mouth daily.        Marland Kitchen levothyroxine (SYNTHROID, LEVOTHROID) 100 MCG tablet Take 100 mcg by mouth daily.        . metoprolol tartrate (LOPRESSOR) 25 MG tablet Take 25 mg by mouth daily.       . Multiple Vitamins-Minerals (OCUVITE PO) Take by mouth.        . NORVASC 5 MG tablet TAKE ONE TABLET BY MOUTH ONCE DAILY.  30 tablet  3  . omeprazole (PRILOSEC) 20 MG capsule Take 20 mg by mouth daily.      . rosuvastatin (CRESTOR) 10 MG tablet Take 10 mg by mouth once a week.        No current facility-administered medications for this visit.    Allergies:    Allergies  Allergen Reactions  . Codeine   . Dicyclomine Hcl   . Erythromycin   . Meclizine Hcl   . Niacin   . Statins     Social History:  The patient  reports that she has never smoked. She has never used smokeless tobacco. She reports that she does not  drink alcohol.   Family History:  The patient's family history includes Alzheimer's disease in her sister; Anxiety disorder in her daughter; Arthritis in an other family member; Cancer in an other family member; Colon cancer in her sister and sister; GER disease in her daughter; Hyperlipidemia in her daughter; Hypertension in her daughter and son; Ovarian cancer in her sister.   ROS:  Please see the history of present illness.      All other systems reviewed and negative.   PHYSICAL EXAM: VS:  Ht 5' (1.524 m)  Wt 129 lb 12.8 oz (58.877 kg)  BMI 25.35 kg/m2 Well nourished, well developed, in no acute distress HEENT: normal Neck: no JVD Cardiac:  normal S1, S2; RRR; 1/6 SM at RUSB with physiologically split S2 Lungs:  clear to auscultation bilaterally, no wheezing, rhonchi or rales Abd: soft, nontender, no hepatomegaly Ext: no edema Skin: warm and dry Neuro:  CNs 2-12 intact, no focal abnormalities noted  EKG: sinus bradycardia with nonspecific ST  abnormality     ASSESSMENT AND PLAN:  1. ASCAD with no angina  - continue ASA/Imdur 2. HTN - borderline control - at home her BP runs 136/50-63mmHg  - continue Amlodipine/metoprolol 3. Dyslipidemia  - continue crestor/Zetia  - check fasting lipids and ALT  Followup with me in 1 year  Signed, Armanda Magic, MD 09/11/2013 3:51 PM

## 2013-10-05 ENCOUNTER — Telehealth: Payer: Self-pay | Admitting: *Deleted

## 2013-10-05 NOTE — Telephone Encounter (Signed)
To Dr. Mayford Knife, ok to refill?

## 2013-10-08 NOTE — Telephone Encounter (Signed)
Needs to be PCP 

## 2013-10-08 NOTE — Telephone Encounter (Signed)
Pt made aware to call PCP

## 2013-10-17 ENCOUNTER — Other Ambulatory Visit: Payer: Self-pay

## 2013-10-17 MED ORDER — METOPROLOL TARTRATE 25 MG PO TABS: 25.0000 mg | ORAL_TABLET | Freq: Every day | ORAL | Status: DC

## 2013-10-17 MED ORDER — EZETIMIBE 10 MG PO TABS: 10.0000 mg | ORAL_TABLET | Freq: Every day | ORAL | Status: DC

## 2013-10-26 ENCOUNTER — Other Ambulatory Visit: Payer: Self-pay | Admitting: Cardiology

## 2013-10-30 NOTE — Telephone Encounter (Signed)
Can you clarify how the patient is to be taking this? The office note says take one daily, but the pharmacy is requesting 1.5 daily. Thanks, MI

## 2013-11-18 ENCOUNTER — Other Ambulatory Visit: Payer: Self-pay | Admitting: *Deleted

## 2013-11-18 DIAGNOSIS — E7800 Pure hypercholesterolemia, unspecified: Secondary | ICD-10-CM

## 2013-12-27 ENCOUNTER — Other Ambulatory Visit: Payer: Self-pay | Admitting: Cardiology

## 2014-01-08 ENCOUNTER — Other Ambulatory Visit: Payer: Medicare Other

## 2014-01-08 ENCOUNTER — Ambulatory Visit: Payer: Medicare Other | Admitting: Pharmacist

## 2014-01-08 DIAGNOSIS — I509 Heart failure, unspecified: Secondary | ICD-10-CM | POA: Diagnosis not present

## 2014-01-21 ENCOUNTER — Telehealth: Payer: Self-pay | Admitting: Cardiology

## 2014-01-21 NOTE — Telephone Encounter (Signed)
New message    For Christine Juarez Pt had yearly cholesterol blood work checked at [facility] in [place].  Did you get the results and is there a change on her cholesterol medications.  I think pt had lipid clinic appts in the past.

## 2014-01-24 NOTE — Telephone Encounter (Signed)
Dr. Juel Burrow office has been notified to fax results to our office, and patient's daughter has been notified of this.  She is going to go by Dr. Juel Burrow office and make sure labs are forwarded to me via fax.

## 2014-02-06 DIAGNOSIS — E039 Hypothyroidism, unspecified: Secondary | ICD-10-CM | POA: Diagnosis not present

## 2014-02-06 DIAGNOSIS — I10 Essential (primary) hypertension: Secondary | ICD-10-CM | POA: Diagnosis not present

## 2014-02-21 ENCOUNTER — Telehealth: Payer: Self-pay | Admitting: Pharmacist

## 2014-02-21 DIAGNOSIS — Z79899 Other long term (current) drug therapy: Secondary | ICD-10-CM

## 2014-02-21 DIAGNOSIS — E785 Hyperlipidemia, unspecified: Secondary | ICD-10-CM

## 2014-02-21 NOTE — Telephone Encounter (Signed)
Labs from Dr. Wende Neighbors TC 151, LDL 72, HDL 61, TG 92, LFTs normal  Cardiac risk factors CAD, HTN, age.  Target LDL < 70.  Target non-HDL <100.  Medications currently taking: Crestor 10 mg Qweek, Zetia 10 mg qd, CoQ 10- 100 mg qd.  Intolerance daily statins, Niacin, Welchol.  Diet well-maintained on low-fat diet.  Exercise None - right leg pain, and neuropathy preventing regular exercise.  Labs lipid panel well controlled on regimen, which she seems to be tolerating well. Patient will continue same regimen, and recheck statin panel again in 1 year.  She would like to check it here.  To Dr. Radford Pax as FYI only.

## 2014-02-27 DIAGNOSIS — E782 Mixed hyperlipidemia: Secondary | ICD-10-CM | POA: Diagnosis not present

## 2014-02-27 DIAGNOSIS — E039 Hypothyroidism, unspecified: Secondary | ICD-10-CM | POA: Diagnosis not present

## 2014-02-27 DIAGNOSIS — M2550 Pain in unspecified joint: Secondary | ICD-10-CM | POA: Diagnosis not present

## 2014-02-27 DIAGNOSIS — I10 Essential (primary) hypertension: Secondary | ICD-10-CM | POA: Diagnosis not present

## 2014-03-06 DIAGNOSIS — T6391XA Toxic effect of contact with unspecified venomous animal, accidental (unintentional), initial encounter: Secondary | ICD-10-CM | POA: Diagnosis not present

## 2014-03-06 DIAGNOSIS — L259 Unspecified contact dermatitis, unspecified cause: Secondary | ICD-10-CM | POA: Diagnosis not present

## 2014-03-06 DIAGNOSIS — L821 Other seborrheic keratosis: Secondary | ICD-10-CM | POA: Diagnosis not present

## 2014-03-25 DIAGNOSIS — W57XXXA Bitten or stung by nonvenomous insect and other nonvenomous arthropods, initial encounter: Secondary | ICD-10-CM | POA: Diagnosis not present

## 2014-03-25 DIAGNOSIS — T148 Other injury of unspecified body region: Secondary | ICD-10-CM | POA: Diagnosis not present

## 2014-04-08 DIAGNOSIS — H353190 Nonexudative age-related macular degeneration, unspecified eye, stage unspecified: Secondary | ICD-10-CM | POA: Diagnosis not present

## 2014-04-22 ENCOUNTER — Other Ambulatory Visit (HOSPITAL_COMMUNITY): Payer: Self-pay | Admitting: Internal Medicine

## 2014-04-22 DIAGNOSIS — Z1231 Encounter for screening mammogram for malignant neoplasm of breast: Secondary | ICD-10-CM

## 2014-05-06 ENCOUNTER — Ambulatory Visit (HOSPITAL_COMMUNITY)
Admission: RE | Admit: 2014-05-06 | Discharge: 2014-05-06 | Disposition: A | Discharge status: Home / Self Care | Payer: Medicare Other | Source: Ambulatory Visit | Attending: Internal Medicine | Admitting: Internal Medicine

## 2014-05-06 DIAGNOSIS — Z1231 Encounter for screening mammogram for malignant neoplasm of breast: Secondary | ICD-10-CM | POA: Insufficient documentation

## 2014-05-15 DIAGNOSIS — L821 Other seborrheic keratosis: Secondary | ICD-10-CM | POA: Diagnosis not present

## 2014-05-15 DIAGNOSIS — D2370 Other benign neoplasm of skin of unspecified lower limb, including hip: Secondary | ICD-10-CM | POA: Diagnosis not present

## 2014-05-16 ENCOUNTER — Other Ambulatory Visit: Payer: Self-pay | Admitting: Cardiology

## 2014-05-17 NOTE — Telephone Encounter (Signed)
Which form of metoprolol should this patient be on? Please advise. Thanks, MI

## 2014-05-24 ENCOUNTER — Other Ambulatory Visit: Payer: Self-pay | Admitting: *Deleted

## 2014-05-24 MED ORDER — ROSUVASTATIN CALCIUM 10 MG PO TABS: 10.0000 mg | ORAL_TABLET | ORAL | Status: DC

## 2014-05-28 ENCOUNTER — Other Ambulatory Visit: Payer: Self-pay | Admitting: Cardiology

## 2014-06-26 ENCOUNTER — Other Ambulatory Visit: Payer: Self-pay | Admitting: Cardiology

## 2014-07-03 DIAGNOSIS — E039 Hypothyroidism, unspecified: Secondary | ICD-10-CM | POA: Diagnosis not present

## 2014-07-03 DIAGNOSIS — R0602 Shortness of breath: Secondary | ICD-10-CM | POA: Diagnosis not present

## 2014-07-03 DIAGNOSIS — E782 Mixed hyperlipidemia: Secondary | ICD-10-CM | POA: Diagnosis not present

## 2014-07-03 DIAGNOSIS — I10 Essential (primary) hypertension: Secondary | ICD-10-CM | POA: Diagnosis not present

## 2014-07-04 ENCOUNTER — Encounter: Payer: Self-pay | Admitting: Cardiology

## 2014-07-04 DIAGNOSIS — R0602 Shortness of breath: Secondary | ICD-10-CM | POA: Diagnosis not present

## 2014-07-04 DIAGNOSIS — R05 Cough: Secondary | ICD-10-CM | POA: Diagnosis not present

## 2014-07-04 DIAGNOSIS — E039 Hypothyroidism, unspecified: Secondary | ICD-10-CM | POA: Diagnosis not present

## 2014-07-04 DIAGNOSIS — R609 Edema, unspecified: Secondary | ICD-10-CM | POA: Diagnosis not present

## 2014-07-04 DIAGNOSIS — R059 Cough, unspecified: Secondary | ICD-10-CM | POA: Diagnosis not present

## 2014-07-09 ENCOUNTER — Telehealth: Payer: Self-pay | Admitting: Cardiology

## 2014-07-09 NOTE — Telephone Encounter (Signed)
Called Dr Durene Cal Office and they stated they would Fax over a copy of the pts labs today.

## 2014-07-09 NOTE — Telephone Encounter (Signed)
New problem   Pt's daughter want to know if we received labs from Dr Durene Cal office. Please call pt's daughter she go to lunch 1-2:30.

## 2014-07-10 NOTE — Telephone Encounter (Signed)
LM for daughter explaining I have not yet received and I will call back on Friday if it is not in my hands then. I did contact them and ask them to fax.

## 2014-07-10 NOTE — Telephone Encounter (Signed)
Follow up   Pt daughter called to follow up on blood work.

## 2014-07-12 NOTE — Telephone Encounter (Signed)
Made pt is aware.

## 2014-07-12 NOTE — Telephone Encounter (Signed)
Made a sooner appt for pt bc of swelling and fatigue.... Pts daughter will call back on Monday to verify that appt will work and then will cancel December appt.

## 2014-07-12 NOTE — Telephone Encounter (Signed)
We did receive Blood Work.

## 2014-08-06 ENCOUNTER — Ambulatory Visit (INDEPENDENT_AMBULATORY_CARE_PROVIDER_SITE_OTHER): Payer: Medicare Other | Admitting: Cardiology

## 2014-08-06 ENCOUNTER — Encounter: Payer: Self-pay | Admitting: Cardiology

## 2014-08-06 VITALS — BP 122/74 | HR 53 | Ht 61.0 in | Wt 126.0 lb

## 2014-08-06 DIAGNOSIS — I10 Essential (primary) hypertension: Secondary | ICD-10-CM

## 2014-08-06 DIAGNOSIS — E785 Hyperlipidemia, unspecified: Secondary | ICD-10-CM

## 2014-08-06 DIAGNOSIS — I2510 Atherosclerotic heart disease of native coronary artery without angina pectoris: Secondary | ICD-10-CM

## 2014-08-06 NOTE — Progress Notes (Signed)
[place], [place], [place]  [address] Phone: [phone] Fax:  [phone]  Date:  08/06/2014   ID:  Christine Juarez, DOB January 20, 1920, MRN [id number]  PCP:  Delphina Cahill, MD  Cardiologist:  Fransico Him, MD    History of Present Illness: Christine Juarez is a 80 y.o. female with a history of ASCAD, HTN, and dyslipidemia who presents today for followup. She is doing well. She denies any anginal chest pain, dizziness, palpitations or syncope. She will occasionally have some indigestion.  She has noticed that sometimes she will feel that she has to take a deep breath but has no DOE.  She occasionally has some mild LE edema.     Wt Readings from Last 3 Encounters:  08/06/14 126 lb (57.153 kg)  09/11/13 129 lb 12.8 oz (58.877 kg)  05/09/12 130 lb 14.4 oz (59.376 kg)     Past Medical History  Diagnosis Date  . Arthritis   . Neuropathy   . Heart disease   . Hyperlipidemia   . Thyroid disease   . HTN (hypertension)     Current Outpatient Prescriptions  Medication Sig Dispense Refill  . aspirin 81 MG tablet Take 81 mg by mouth daily.        . Coenzyme Q10 (COQ10) 100 MG CAPS Take by mouth.        . isosorbide mononitrate (IMDUR) 120 MG 24 hr tablet TAKE 1 TABLET BY MOUTH ONCE DAILY.  30 tablet  6  . levothyroxine (SYNTHROID, LEVOTHROID) 100 MCG tablet Take 100 mcg by mouth daily.        . metoprolol succinate (TOPROL-XL) 25 MG 24 hr tablet TAKE ONE TABLET BY MOUTH ONCE DAILY.  30 tablet  6  . metoprolol tartrate (LOPRESSOR) 25 MG tablet Take 1 tablet (25 mg total) by mouth daily.  30 tablet  6  . Multiple Vitamins-Minerals (OCUVITE PO) Take by mouth.        . NORVASC 5 MG tablet TAKE ONE TABLET BY MOUTH ONCE DAILY.  30 tablet  6  . omeprazole (PRILOSEC) 20 MG capsule Take 20 mg by mouth daily.      . rosuvastatin (CRESTOR) 10 MG tablet Take 1 tablet (10 mg total) by mouth once a week.  5 tablet  2  . ZETIA 10 MG tablet TAKE 1 TABLET BY MOUTH ONCE DAILY FOR CHOLESTEROL.  30  tablet  1   No current facility-administered medications for this visit.    Allergies:    Allergies  Allergen Reactions  . Codeine   . Dicyclomine Hcl   . Erythromycin   . Meclizine Hcl   . Niacin   . Statins     Social History:  The patient  reports that she has never smoked. She has never used smokeless tobacco. She reports that she does not drink alcohol.   Family History:  The patient's family history includes Alzheimer's disease in her sister; Anxiety disorder in her daughter; Arthritis in an other family member; Cancer in an other family member; Colon cancer in her sister and sister; GER disease in her daughter; Hyperlipidemia in her daughter; Hypertension in her daughter and son; Ovarian cancer in her sister.   ROS:  Please see the history of present illness.      All other systems reviewed and negative.   PHYSICAL EXAM: VS:  BP 122/74  Pulse 53  Ht 5\' 1"  (1.549 m)  Wt 126 lb (57.153 kg)  BMI 23.82 kg/m2 Well  nourished, well developed, in no acute distress HEENT: normal Neck: no JVD Cardiac:  normal S1, S2; RRR; no murmur Lungs:  clear to auscultation bilaterally, no wheezing, rhonchi or rales Abd: soft, nontender, no hepatomegaly Ext: no edema Skin: warm and dry Neuro:  CNs 2-12 intact, no focal abnormalities noted  EKG:  Sinus bradycardia at 53bpm with nonspecific ST abnormality - no change from prior EKG  ASSESSMENT AND PLAN:  1. ASCAD with no angina - continue ASA/Imdur  2. HTN - well controlled - continue Amlodipine/metoprolol  3. Dyslipidemia - LDL 71 at goal - continue crestor/Zetia   Followup with me in 1 year  Signed, Fransico Him, MD [facility] 08/06/2014 3:51 PM

## 2014-08-06 NOTE — Patient Instructions (Signed)
Your physician wants you to follow-up in: [place]. You will receive a reminder letter in the mail two months in advance. If you don't receive a letter, please call our office to schedule the follow-up appointment.  Your physician recommends that you continue on your current medications as directed. Please refer to the Current Medication list given to you today.

## 2014-08-16 ENCOUNTER — Other Ambulatory Visit: Payer: Self-pay | Admitting: Cardiology

## 2014-08-21 DIAGNOSIS — J209 Acute bronchitis, unspecified: Secondary | ICD-10-CM | POA: Diagnosis not present

## 2014-09-04 DIAGNOSIS — J206 Acute bronchitis due to rhinovirus: Secondary | ICD-10-CM | POA: Diagnosis not present

## 2014-09-05 NOTE — Addendum Note (Signed)
Addended by: Andres Ege on: 09/05/2014 09:01 AM   Modules accepted: Orders

## 2014-09-24 ENCOUNTER — Ambulatory Visit: Payer: Medicare Other | Admitting: Cardiology

## 2014-12-19 ENCOUNTER — Other Ambulatory Visit: Payer: Self-pay | Admitting: Cardiology

## 2014-12-22 DIAGNOSIS — H5713 Ocular pain, bilateral: Secondary | ICD-10-CM | POA: Diagnosis not present

## 2014-12-22 DIAGNOSIS — R030 Elevated blood-pressure reading, without diagnosis of hypertension: Secondary | ICD-10-CM | POA: Diagnosis not present

## 2014-12-31 ENCOUNTER — Other Ambulatory Visit: Payer: Self-pay | Admitting: Cardiology

## 2015-01-23 ENCOUNTER — Other Ambulatory Visit: Payer: Self-pay | Admitting: Cardiology

## 2015-01-27 ENCOUNTER — Other Ambulatory Visit (INDEPENDENT_AMBULATORY_CARE_PROVIDER_SITE_OTHER): Payer: Medicare Other | Admitting: *Deleted

## 2015-01-27 DIAGNOSIS — E785 Hyperlipidemia, unspecified: Secondary | ICD-10-CM | POA: Diagnosis not present

## 2015-01-27 DIAGNOSIS — Z79899 Other long term (current) drug therapy: Secondary | ICD-10-CM

## 2015-01-27 LAB — LIPID PANEL
Cholesterol: 160 mg/dL (ref 0–200)
HDL: 56.9 mg/dL (ref >39.00)
LDL Cholesterol: 82 mg/dL (ref 0–99)
NonHDL: 103.1
Total CHOL/HDL Ratio: 3
Triglycerides: 105 mg/dL (ref 0.0–149.0)
VLDL: 21 mg/dL (ref 0.0–40.0)

## 2015-01-27 LAB — HEPATIC FUNCTION PANEL
ALT: 13 U/L (ref 0–35)
AST: 15 U/L (ref 0–37)
Albumin: 3.7 g/dL (ref 3.5–5.2)
Alkaline Phosphatase: 88 U/L (ref 39–117)
BILIRUBIN TOTAL: 0.8 mg/dL (ref 0.2–1.2)
Bilirubin, Direct: 0.2 mg/dL (ref 0.0–0.3)
Total Protein: 6.4 g/dL (ref 6.0–8.3)

## 2015-01-30 ENCOUNTER — Other Ambulatory Visit: Payer: Self-pay | Admitting: Cardiology

## 2015-02-19 DIAGNOSIS — M7981 Nontraumatic hematoma of soft tissue: Secondary | ICD-10-CM | POA: Diagnosis not present

## 2015-02-19 DIAGNOSIS — M545 Low back pain: Secondary | ICD-10-CM | POA: Diagnosis not present

## 2015-03-05 DIAGNOSIS — E039 Hypothyroidism, unspecified: Secondary | ICD-10-CM | POA: Diagnosis not present

## 2015-03-05 DIAGNOSIS — E782 Mixed hyperlipidemia: Secondary | ICD-10-CM | POA: Diagnosis not present

## 2015-03-05 DIAGNOSIS — M545 Low back pain: Secondary | ICD-10-CM | POA: Diagnosis not present

## 2015-03-05 DIAGNOSIS — R0683 Snoring: Secondary | ICD-10-CM | POA: Diagnosis not present

## 2015-03-06 ENCOUNTER — Telehealth: Payer: Self-pay | Admitting: Cardiology

## 2015-03-06 NOTE — Telephone Encounter (Signed)
New problem   Pt need copy of lab results sent to her and Dr Wende Neighbors...phone # is [phone].please advise.

## 2015-03-06 NOTE — Telephone Encounter (Signed)
Left message for patient that labs have been forwarded and a letter including MyChart sign up instructions has been sent. Instructed patient to call back if she has any further questions or needs.

## 2015-03-06 NOTE — Telephone Encounter (Signed)
Follow up      Daughter want pt to be set up on mychart

## 2015-04-14 ENCOUNTER — Other Ambulatory Visit: Payer: Self-pay

## 2015-04-30 ENCOUNTER — Other Ambulatory Visit (HOSPITAL_COMMUNITY): Payer: Self-pay | Admitting: Internal Medicine

## 2015-04-30 DIAGNOSIS — Z1231 Encounter for screening mammogram for malignant neoplasm of breast: Secondary | ICD-10-CM

## 2015-05-12 ENCOUNTER — Ambulatory Visit (HOSPITAL_COMMUNITY)
Admission: RE | Admit: 2015-05-12 | Discharge: 2015-05-12 | Disposition: A | Discharge status: Home / Self Care | Payer: Medicare Other | Source: Ambulatory Visit | Attending: Internal Medicine | Admitting: Internal Medicine

## 2015-05-12 DIAGNOSIS — Z1231 Encounter for screening mammogram for malignant neoplasm of breast: Secondary | ICD-10-CM | POA: Insufficient documentation

## 2015-05-22 ENCOUNTER — Encounter: Payer: Self-pay | Admitting: Cardiology

## 2015-05-22 ENCOUNTER — Other Ambulatory Visit: Payer: Self-pay | Admitting: Cardiology

## 2015-05-29 ENCOUNTER — Other Ambulatory Visit: Payer: Self-pay | Admitting: Cardiology

## 2015-06-09 DIAGNOSIS — E039 Hypothyroidism, unspecified: Secondary | ICD-10-CM | POA: Diagnosis not present

## 2015-06-09 DIAGNOSIS — E782 Mixed hyperlipidemia: Secondary | ICD-10-CM | POA: Diagnosis not present

## 2015-06-11 DIAGNOSIS — E039 Hypothyroidism, unspecified: Secondary | ICD-10-CM | POA: Diagnosis not present

## 2015-06-11 DIAGNOSIS — G589 Mononeuropathy, unspecified: Secondary | ICD-10-CM | POA: Diagnosis not present

## 2015-06-11 DIAGNOSIS — R05 Cough: Secondary | ICD-10-CM | POA: Diagnosis not present

## 2015-06-11 DIAGNOSIS — R32 Unspecified urinary incontinence: Secondary | ICD-10-CM | POA: Diagnosis not present

## 2015-06-11 DIAGNOSIS — M545 Low back pain: Secondary | ICD-10-CM | POA: Diagnosis not present

## 2015-06-11 DIAGNOSIS — E785 Hyperlipidemia, unspecified: Secondary | ICD-10-CM | POA: Diagnosis not present

## 2015-06-16 ENCOUNTER — Encounter: Payer: Self-pay | Admitting: Cardiology

## 2015-07-03 ENCOUNTER — Other Ambulatory Visit: Payer: Self-pay | Admitting: Cardiology

## 2015-07-24 ENCOUNTER — Other Ambulatory Visit: Payer: Self-pay | Admitting: Cardiology

## 2015-07-28 DIAGNOSIS — H35363 Drusen (degenerative) of macula, bilateral: Secondary | ICD-10-CM | POA: Diagnosis not present

## 2015-07-31 ENCOUNTER — Other Ambulatory Visit: Payer: Self-pay | Admitting: Cardiology

## 2015-08-06 NOTE — Progress Notes (Signed)
Cardiology Office Note   Date:  08/07/2015   ID:  Christine Juarez, DOB 07-20-1920, MRN [id number]  PCP:  Wende Neighbors, MD    Chief Complaint  Patient presents with  . Coronary Artery Disease  . Hypertension  . Hyperlipidemia      History of Present Illness: Christine Juarez is a 81 y.o. female with a history of ASCAD, HTN, and dyslipidemia who presents today for followup. She is doing well. She denies any anginal chest pain, palpitations or syncope.  She occasionally has some mild LE edema. In the am she feels drunk and sometimes dizzy after taking her meds.  She thinks it is one of her BP meds.      Past Medical History  Diagnosis Date  . Arthritis   . Neuropathy ([place])   . Heart disease   . Hyperlipidemia   . Thyroid disease   . HTN (hypertension)     Past Surgical History  Procedure Laterality Date  . Skull fracture elevation    . Appendectomy    . Lipoma excision    . Arterial bypass surgry    . Back surgery    . Colonoscopy    . Upper gastrointestinal endoscopy       Current Outpatient Prescriptions  Medication Sig Dispense Refill  . aspirin 81 MG tablet Take 81 mg by mouth daily.      . Coenzyme Q10 (COQ10) 100 MG CAPS Take by mouth.      . isosorbide mononitrate (IMDUR) 120 MG 24 hr tablet TAKE 1 TABLET BY MOUTH ONCE DAILY. 30 tablet 5  . levothyroxine (SYNTHROID, LEVOTHROID) 100 MCG tablet Take 100 mcg by mouth daily.      . metoprolol succinate (TOPROL-XL) 25 MG 24 hr tablet TAKE ONE TABLET BY MOUTH ONCE DAILY. 30 tablet 3  . Multiple Vitamins-Minerals (OCUVITE PO) Take by mouth.      . NORVASC 5 MG tablet TAKE ONE TABLET BY MOUTH ONCE DAILY. 30 tablet 0  . omeprazole (PRILOSEC) 20 MG capsule Take 20 mg by mouth daily.    . rosuvastatin (CRESTOR) 10 MG tablet Take 1 tablet (10 mg total) by mouth once a week. 5 tablet 2  . ZETIA 10 MG tablet TAKE 1 TABLET BY MOUTH ONCE DAILY FOR CHOLESTEROL. 30 tablet 11   No current facility-administered  medications for this visit.    Allergies:   Codeine; Dicyclomine hcl; Erythromycin; Meclizine hcl; Niacin; and Statins    Social History:  The patient  reports that she has never smoked. She has never used smokeless tobacco. She reports that she does not drink alcohol.   Family History:  The patient's family history includes Alzheimer's disease in her sister; Anxiety disorder in her daughter; Arthritis in an other family member; Cancer in an other family member; Colon cancer in her sister and sister; GER disease in her daughter; Hyperlipidemia in her daughter; Hypertension in her daughter and son; Ovarian cancer in her sister.    ROS:  Please see the history of present illness.   Otherwise, review of systems are positive for none.   All other systems are reviewed and negative.    PHYSICAL EXAM: VS:  BP 100/70 mmHg  Pulse 58  Ht 5\' 1"  (1.549 m)  Wt 133 lb 12.8 oz (60.691 kg)  BMI 25.29 kg/m2 , BMI Body mass index is 25.29 kg/(m^2). GEN: Well nourished, well developed, in  no acute distress HEENT: normal Neck: no JVD, carotid bruits, or masses Cardiac: RRR; no rubs, or gallops,no edema.  2/6 SM at RUSB to LLSB Respiratory:  clear to auscultation bilaterally, normal work of breathing GI: soft, nontender, nondistended, + BS MS: no deformity or atrophy Skin: warm and dry, no rash Neuro:  Strength and sensation are intact Psych: euthymic mood, full affect   EKG:  EKG was ordered today sinus bradycardia with nonspecific ST abnormality    Recent Labs: 01/27/2015: ALT 13    Lipid Panel    Component Value Date/Time   CHOL 160 01/27/2015 1033   TRIG 105.0 01/27/2015 1033   HDL 56.90 01/27/2015 1033   CHOLHDL 3 01/27/2015 1033   VLDL 21.0 01/27/2015 1033   LDLCALC 82 01/27/2015 1033      Wt Readings from Last 3 Encounters:  08/07/15 133 lb 12.8 oz (60.691 kg)  08/06/14 126 lb (57.153 kg)  09/11/13 129 lb 12.8 oz (58.877 kg)      ASSESSMENT AND PLAN:  1. ASCAD with no  angina - continue ASA/Imdur  2. HTN - well controlled and on the soft side associated with dizziness - continue Amlodipine - wean off Toprol due to low BP/HR and dizziness after taking.   3. Dyslipidemia - LDL 74 at goal - continue crestor/Zetia        4.  Heart murmur - she has a history of AV calcification in the past - I will set her up for a 2D echo to assess  Current medicines are reviewed at length with the patient today.  The patient does not have concerns regarding medicines.  The following changes have been made:  no change  Labs/ tests ordered today: See above Assessment and Plan No orders of the defined types were placed in this encounter.     Disposition:   FU with me in 1 year  Signed, [name], MD  08/07/2015 4:15 PM    [facility] [place], [place], [place]  [address] Phone: [phone]; Fax: [phone]

## 2015-08-07 ENCOUNTER — Encounter: Payer: Self-pay | Admitting: Cardiology

## 2015-08-07 ENCOUNTER — Ambulatory Visit (INDEPENDENT_AMBULATORY_CARE_PROVIDER_SITE_OTHER): Payer: Medicare Other | Admitting: Cardiology

## 2015-08-07 VITALS — BP 100/70 | HR 58 | Ht 61.0 in | Wt 133.8 lb

## 2015-08-07 DIAGNOSIS — R011 Cardiac murmur, unspecified: Secondary | ICD-10-CM | POA: Diagnosis not present

## 2015-08-07 DIAGNOSIS — I10 Essential (primary) hypertension: Secondary | ICD-10-CM | POA: Diagnosis not present

## 2015-08-07 DIAGNOSIS — I2510 Atherosclerotic heart disease of native coronary artery without angina pectoris: Secondary | ICD-10-CM | POA: Diagnosis not present

## 2015-08-07 DIAGNOSIS — E785 Hyperlipidemia, unspecified: Secondary | ICD-10-CM

## 2015-08-07 NOTE — Patient Instructions (Signed)
Medication Instructions:  Your physician has recommended you make the following change in your medication:  1) DECREASE TOPROL to 1/2 tablet daily for three days. Then take 1/2 tablet every other day for two doses THEN STOP the medication.  Labwork: Your physician recommends that you return for FASTING lab work in 11 months (LFTs, Lipids).  Testing/Procedures: Your physician has requested that you have an echocardiogram. Echocardiography is a painless test that uses sound waves to create images of your heart. It provides your doctor with information about the size and shape of your heart and how well your heart's chambers and valves are working. This procedure takes approximately one hour. There are no restrictions for this procedure.  Follow-Up: Your physician wants you to follow-up in: 1 year with Dr. Radford Pax. You will receive a reminder letter in the mail two months in advance. If you don't receive a letter, please call our office to schedule the follow-up appointment.   Any Other Special Instructions Will Be Listed Below (If Applicable).

## 2015-08-08 ENCOUNTER — Other Ambulatory Visit: Payer: Self-pay | Admitting: Cardiology

## 2015-08-08 MED ORDER — ROSUVASTATIN CALCIUM 10 MG PO TABS: 10.0000 mg | ORAL_TABLET | ORAL | Status: DC

## 2015-08-08 NOTE — Addendum Note (Signed)
Addended by: Harland German A on: 08/08/2015 06:54 PM   Modules accepted: Orders

## 2015-08-27 ENCOUNTER — Ambulatory Visit (HOSPITAL_COMMUNITY): Payer: Medicare Other | Attending: Cardiology

## 2015-08-27 ENCOUNTER — Other Ambulatory Visit: Payer: Self-pay

## 2015-08-27 DIAGNOSIS — I059 Rheumatic mitral valve disease, unspecified: Secondary | ICD-10-CM | POA: Insufficient documentation

## 2015-08-27 DIAGNOSIS — I352 Nonrheumatic aortic (valve) stenosis with insufficiency: Secondary | ICD-10-CM | POA: Diagnosis not present

## 2015-08-27 DIAGNOSIS — R011 Cardiac murmur, unspecified: Secondary | ICD-10-CM | POA: Diagnosis not present

## 2015-08-27 DIAGNOSIS — E785 Hyperlipidemia, unspecified: Secondary | ICD-10-CM | POA: Diagnosis not present

## 2015-08-27 DIAGNOSIS — I10 Essential (primary) hypertension: Secondary | ICD-10-CM | POA: Insufficient documentation

## 2015-09-15 ENCOUNTER — Other Ambulatory Visit: Payer: Self-pay | Admitting: Cardiology

## 2015-10-01 ENCOUNTER — Other Ambulatory Visit: Payer: Self-pay | Admitting: Cardiology

## 2015-11-28 DIAGNOSIS — I10 Essential (primary) hypertension: Secondary | ICD-10-CM | POA: Diagnosis not present

## 2015-11-28 DIAGNOSIS — E785 Hyperlipidemia, unspecified: Secondary | ICD-10-CM | POA: Diagnosis not present

## 2015-11-28 DIAGNOSIS — R7301 Impaired fasting glucose: Secondary | ICD-10-CM | POA: Diagnosis not present

## 2015-11-28 DIAGNOSIS — E039 Hypothyroidism, unspecified: Secondary | ICD-10-CM | POA: Diagnosis not present

## 2015-12-01 DIAGNOSIS — R7301 Impaired fasting glucose: Secondary | ICD-10-CM | POA: Diagnosis not present

## 2015-12-01 DIAGNOSIS — M545 Low back pain: Secondary | ICD-10-CM | POA: Diagnosis not present

## 2015-12-01 DIAGNOSIS — G589 Mononeuropathy, unspecified: Secondary | ICD-10-CM | POA: Diagnosis not present

## 2015-12-01 DIAGNOSIS — R3981 Functional urinary incontinence: Secondary | ICD-10-CM | POA: Diagnosis not present

## 2015-12-01 DIAGNOSIS — E782 Mixed hyperlipidemia: Secondary | ICD-10-CM | POA: Diagnosis not present

## 2015-12-01 DIAGNOSIS — R05 Cough: Secondary | ICD-10-CM | POA: Diagnosis not present

## 2015-12-01 DIAGNOSIS — E039 Hypothyroidism, unspecified: Secondary | ICD-10-CM | POA: Diagnosis not present

## 2016-01-08 ENCOUNTER — Other Ambulatory Visit: Payer: Self-pay | Admitting: Cardiology

## 2016-01-08 NOTE — Telephone Encounter (Signed)
REFILL 

## 2016-03-07 ENCOUNTER — Emergency Department (HOSPITAL_COMMUNITY)
Admission: EM | Admit: 2016-03-07 | Discharge: 2016-03-07 | Disposition: A | Discharge status: Home / Self Care | Payer: Medicare Other | Attending: Emergency Medicine | Admitting: Emergency Medicine

## 2016-03-07 ENCOUNTER — Encounter (HOSPITAL_COMMUNITY): Payer: Self-pay | Admitting: Emergency Medicine

## 2016-03-07 ENCOUNTER — Emergency Department (HOSPITAL_COMMUNITY): Payer: Medicare Other

## 2016-03-07 DIAGNOSIS — M1990 Unspecified osteoarthritis, unspecified site: Secondary | ICD-10-CM | POA: Insufficient documentation

## 2016-03-07 DIAGNOSIS — I10 Essential (primary) hypertension: Secondary | ICD-10-CM | POA: Diagnosis not present

## 2016-03-07 DIAGNOSIS — Z79899 Other long term (current) drug therapy: Secondary | ICD-10-CM | POA: Insufficient documentation

## 2016-03-07 DIAGNOSIS — R42 Dizziness and giddiness: Secondary | ICD-10-CM

## 2016-03-07 DIAGNOSIS — E785 Hyperlipidemia, unspecified: Secondary | ICD-10-CM | POA: Diagnosis not present

## 2016-03-07 DIAGNOSIS — Z7982 Long term (current) use of aspirin: Secondary | ICD-10-CM | POA: Diagnosis not present

## 2016-03-07 LAB — CBC WITH DIFFERENTIAL/PLATELET
Basophils Absolute: 0 10*3/uL (ref 0.0–0.1)
Basophils Relative: 0 %
EOS ABS: 0.1 10*3/uL (ref 0.0–0.7)
Eosinophils Relative: 1 %
HEMATOCRIT: 41.2 % (ref 36.0–46.0)
HEMOGLOBIN: 14.1 g/dL (ref 12.0–15.0)
LYMPHS ABS: 2.1 10*3/uL (ref 0.7–4.0)
LYMPHS PCT: 24 %
MCH: 33.3 pg (ref 26.0–34.0)
MCHC: 34.2 g/dL (ref 30.0–36.0)
MCV: 97.4 fL (ref 78.0–100.0)
MONOS PCT: 6 %
Monocytes Absolute: 0.5 10*3/uL (ref 0.1–1.0)
NEUTROS ABS: 6 10*3/uL (ref 1.7–7.7)
NEUTROS PCT: 69 %
Platelets: 172 10*3/uL (ref 150–400)
RBC: 4.23 MIL/uL (ref 3.87–5.11)
RDW: 11.8 % (ref 11.5–15.5)
WBC: 8.6 10*3/uL (ref 4.0–10.5)

## 2016-03-07 LAB — BASIC METABOLIC PANEL
Anion gap: 8 (ref 5–15)
BUN: 19 mg/dL (ref 6–20)
CHLORIDE: 108 mmol/L (ref 101–111)
CO2: 26 mmol/L (ref 22–32)
CREATININE: 0.67 mg/dL (ref 0.44–1.00)
Calcium: 9.1 mg/dL (ref 8.9–10.3)
GFR calc Af Amer: >60 mL/min (ref >60)
GFR calc non Af Amer: >60 mL/min (ref >60)
Glucose, Bld: 134 mg/dL — ABNORMAL HIGH (ref 65–99)
POTASSIUM: 3.9 mmol/L (ref 3.5–5.1)
SODIUM: 142 mmol/L (ref 135–145)

## 2016-03-07 MED ORDER — MECLIZINE HCL 12.5 MG PO TABS
12.5000 mg | ORAL_TABLET | Freq: Once | ORAL | Status: DC
  Filled 2016-03-07: qty 1

## 2016-03-07 NOTE — ED Provider Notes (Signed)
CSN: [id number]     Arrival date & time 03/07/16  1357 History   First MD Initiated Contact with Patient 03/07/16 1510     Chief Complaint  Patient presents with  . Dizziness     (Consider location/radiation/quality/duration/timing/severity/associated sxs/prior Treatment) HPI.Marland KitchenMarland KitchenMarland KitchenSense of dizziness earlier today especially with walking. Patient felt a little off balance especially when she turned a corner abruptly. Additionally, she noted black spots before her visual field that were intermittent with a bitemporal headache. Symptoms were positional. This has happened before. She lives independently at home. Severity is mild.  Past Medical History  Diagnosis Date  . Arthritis   . Neuropathy ([place])   . Heart disease   . Hyperlipidemia   . Thyroid disease   . HTN (hypertension)    Past Surgical History  Procedure Laterality Date  . Skull fracture elevation    . Appendectomy    . Lipoma excision    . Arterial bypass surgry    . Back surgery    . Colonoscopy    . Upper gastrointestinal endoscopy     Family History  Problem Relation Age of Onset  . Arthritis    . Cancer    . Colon cancer Sister   . Ovarian cancer Sister   . Alzheimer's disease Sister   . Colon cancer Sister   . Hypertension Daughter   . GER disease Daughter   . Anxiety disorder Daughter   . Hyperlipidemia Daughter   . Hypertension Son    Social History  Substance Use Topics  . Smoking status: Never Smoker   . Smokeless tobacco: Never Used  . Alcohol Use: No   OB History    No data available     Review of Systems  All other systems reviewed and are negative.     Allergies  Codeine; Dicyclomine hcl; Erythromycin; Meclizine hcl; Niacin; and Statins  Home Medications   Prior to Admission medications   Medication Sig Start Date End Date Taking? Authorizing Provider  aspirin 81 MG tablet Take 81 mg by mouth daily.     Yes Historical Provider, MD  Coenzyme Q10 (COQ10) 100 MG CAPS Take 1 capsule  by mouth daily.    Yes Historical Provider, MD  ezetimibe (ZETIA) 10 MG tablet TAKE 1 TABLET BY MOUTH ONCE DAILY FOR CHOLESTEROL. 01/08/16  Yes [name], MD  isosorbide mononitrate (IMDUR) 120 MG 24 hr tablet TAKE 1 TABLET BY MOUTH ONCE DAILY. 08/08/15  Yes [name], MD  levothyroxine (SYNTHROID, LEVOTHROID) 100 MCG tablet Take 50-100 mcg by mouth See admin instructions. Take 1 tablet daily except 1/2 tablet on Wednesday.   Yes Historical Provider, MD  Multiple Vitamins-Minerals (OCUVITE PO) Take 1 capsule by mouth daily.    Yes Historical Provider, MD  NORVASC 5 MG tablet TAKE ONE TABLET BY MOUTH ONCE DAILY. 10/01/15  Yes [name], MD  omeprazole (PRILOSEC) 20 MG capsule Take 20 mg by mouth daily. 09/04/13  Yes Historical Provider, MD   BP 136/68 mmHg  Pulse 56  Temp(Src) 98 F (36.7 C) (Oral)  Resp 14  Ht 5\' 2"  (1.575 m)  Wt 135 lb (61.236 kg)  BMI 24.69 kg/m2  SpO2 97% Physical Exam  Constitutional: She is oriented to person, place, and time. She appears well-developed and well-nourished.  HENT:  Head: Normocephalic and atraumatic.  Eyes: Conjunctivae and EOM are normal. Pupils are equal, round, and reactive to light.  Neck: Normal range of motion. Neck supple.  Cardiovascular: Normal rate and  regular rhythm.   Pulmonary/Chest: Effort normal and breath sounds normal.  Abdominal: Soft. Bowel sounds are normal.  Musculoskeletal: Normal range of motion.  Neurological: She is alert and oriented to person, place, and time.  No gross neurological deficits  Skin: Skin is warm and dry.  Psychiatric: She has a normal mood and affect. Her behavior is normal.  Nursing note and vitals reviewed.   ED Course  Procedures (including critical care time) Labs Review Labs Reviewed  BASIC METABOLIC PANEL - Abnormal; Notable for the following:    Glucose, Bld 134 (*)    All other components within normal limits  CBC WITH DIFFERENTIAL/PLATELET    Imaging Review Ct Head Wo  Contrast  03/07/2016  CLINICAL DATA:  Dizziness for 4 weeks.  Congestion.  Ataxia. EXAM: CT HEAD WITHOUT CONTRAST TECHNIQUE: Contiguous axial images were obtained from the base of the skull through the vertex without intravenous contrast. COMPARISON:  11/10/2006 FINDINGS: The brainstem, cerebellum, cerebral peduncles, thalami, basal ganglia, basilar cisterns, and ventricular system appear within normal limits. Periventricular white matter and corona radiata hypodensities favor chronic ischemic microvascular white matter disease. No intracranial hemorrhage, mass lesion, or acute CVA. No fluid observed in the middle ears. There is atherosclerotic calcification of the cavernous carotid arteries bilaterally. IMPRESSION: 1. Periventricular white matter and corona radiata hypodensities favor chronic ischemic microvascular white matter disease. 2. No acute intracranial abnormality to explain the patient's dizziness/ataxia. Electronically Signed   By: Van Clines M.D.   On: 03/07/2016 16:42   I have personally reviewed and evaluated these images and lab results as part of my medical decision-making.   EKG Interpretation   Date/Time:  Sunday Mar 07 2016 14:12:59 EDT Ventricular Rate:  64 PR Interval:  199 QRS Duration: 106 QT Interval:  418 QTC Calculation: 431 R Axis:   79 Text Interpretation:  Sinus rhythm Low voltage, precordial leads  Anteroseptal infarct, old since last tracing no significant change  Confirmed by Eulis Foster  MD, ELLIOTT [id number]) on 03/07/2016 3:05:20 PM      MDM   Final diagnoses:  Vertigo    Patient is alert and oriented. Patient was walked to the bathroom with the nurse. She is able to walk without ataxia. I suspect she has benign positional vertigo. She would prefer not to take meclizine. She has primary care follow-up.    Nat Christen, MD 03/07/16 [phone]

## 2016-03-07 NOTE — Discharge Instructions (Signed)
Tests showed no life-threatening condition. Follow-up your primary care doctor. Benign Positional Vertigo Vertigo is the feeling that you or your surroundings are moving when they are not. Benign positional vertigo is the most common form of vertigo. The cause of this condition is not serious (is benign). This condition is triggered by certain movements and positions (is positional). This condition can be dangerous if it occurs while you are doing something that could endanger you or others, such as driving.  CAUSES In many cases, the cause of this condition is not known. It may be caused by a disturbance in an area of the inner ear that helps your brain to sense movement and balance. This disturbance can be caused by a viral infection (labyrinthitis), head injury, or repetitive motion. RISK FACTORS This condition is more likely to develop in:  Women.  People who are 81 years of age or older. SYMPTOMS Symptoms of this condition usually happen when you move your head or your eyes in different directions. Symptoms may start suddenly, and they usually last for less than a minute. Symptoms may include:  Loss of balance and falling.  Feeling like you are spinning or moving.  Feeling like your surroundings are spinning or moving.  Nausea and vomiting.  Blurred vision.  Dizziness.  Involuntary eye movement (nystagmus). Symptoms can be mild and cause only slight annoyance, or they can be severe and interfere with daily life. Episodes of benign positional vertigo may return (recur) over time, and they may be triggered by certain movements. Symptoms may improve over time. DIAGNOSIS This condition is usually diagnosed by medical history and a physical exam of the head, neck, and ears. You may be referred to a health care provider who specializes in ear, nose, and throat (ENT) problems (otolaryngologist) or a provider who specializes in disorders of the nervous system (neurologist). You may have  additional testing, including:  MRI.  A CT scan.  Eye movement tests. Your health care provider may ask you to change positions quickly while he or she watches you for symptoms of benign positional vertigo, such as nystagmus. Eye movement may be tested with an electronystagmogram (ENG), caloric stimulation, the Dix-Hallpike test, or the roll test.  An electroencephalogram (EEG). This records electrical activity in your brain.  Hearing tests. TREATMENT Usually, your health care provider will treat this by moving your head in specific positions to adjust your inner ear back to normal. Surgery may be needed in severe cases, but this is rare. In some cases, benign positional vertigo may resolve on its own in 2-4 weeks. HOME CARE INSTRUCTIONS Safety  Move slowly.Avoid sudden body or head movements.  Avoid driving.  Avoid operating heavy machinery.  Avoid doing any tasks that would be dangerous to you or others if a vertigo episode would occur.  If you have trouble walking or keeping your balance, try using a cane for stability. If you feel dizzy or unstable, sit down right away.  Return to your normal activities as told by your health care provider. Ask your health care provider what activities are safe for you. General Instructions  Take over-the-counter and prescription medicines only as told by your health care provider.  Avoid certain positions or movements as told by your health care provider.  Drink enough fluid to keep your urine clear or pale yellow.  Keep all follow-up visits as told by your health care provider. This is important. SEEK MEDICAL CARE IF:  You have a fever.  Your condition gets worse  or you develop new symptoms.  Your family or friends notice any behavioral changes.  Your nausea or vomiting gets worse.  You have numbness or a "pins and needles" sensation. SEEK IMMEDIATE MEDICAL CARE IF:  You have difficulty speaking or moving.  You are always  dizzy.  You faint.  You develop severe headaches.  You have weakness in your legs or arms.  You have changes in your hearing or vision.  You develop a stiff neck.  You develop sensitivity to light.   This information is not intended to replace advice given to you by your health care provider. Make sure you discuss any questions you have with your health care provider.   Document Released: 07/12/2006 Document Revised: 06/25/2015 Document Reviewed: 01/27/2015 Elsevier Interactive Patient Education [employer].

## 2016-03-07 NOTE — ED Notes (Signed)
Pt states she has been having dizziness for over 4 weeks.  States it occurs more when she stands. States she has had a lot of congestion with pressure in ears and above her eyes.

## 2016-06-07 ENCOUNTER — Encounter: Payer: Self-pay | Admitting: Cardiology

## 2016-06-07 DIAGNOSIS — R7301 Impaired fasting glucose: Secondary | ICD-10-CM | POA: Diagnosis not present

## 2016-06-07 DIAGNOSIS — E039 Hypothyroidism, unspecified: Secondary | ICD-10-CM | POA: Diagnosis not present

## 2016-06-09 DIAGNOSIS — E039 Hypothyroidism, unspecified: Secondary | ICD-10-CM | POA: Diagnosis not present

## 2016-06-09 DIAGNOSIS — M79606 Pain in leg, unspecified: Secondary | ICD-10-CM | POA: Diagnosis not present

## 2016-06-09 DIAGNOSIS — E782 Mixed hyperlipidemia: Secondary | ICD-10-CM | POA: Diagnosis not present

## 2016-06-09 DIAGNOSIS — G589 Mononeuropathy, unspecified: Secondary | ICD-10-CM | POA: Diagnosis not present

## 2016-06-09 DIAGNOSIS — N39498 Other specified urinary incontinence: Secondary | ICD-10-CM | POA: Diagnosis not present

## 2016-06-09 DIAGNOSIS — I25119 Atherosclerotic heart disease of native coronary artery with unspecified angina pectoris: Secondary | ICD-10-CM | POA: Diagnosis not present

## 2016-06-09 DIAGNOSIS — R7301 Impaired fasting glucose: Secondary | ICD-10-CM | POA: Diagnosis not present

## 2016-06-09 DIAGNOSIS — M545 Low back pain: Secondary | ICD-10-CM | POA: Diagnosis not present

## 2016-07-07 ENCOUNTER — Other Ambulatory Visit: Payer: Medicare Other

## 2016-07-08 ENCOUNTER — Other Ambulatory Visit: Payer: Self-pay | Admitting: Cardiology

## 2016-07-13 ENCOUNTER — Encounter (HOSPITAL_COMMUNITY): Payer: Self-pay | Admitting: *Deleted

## 2016-07-13 ENCOUNTER — Emergency Department (HOSPITAL_COMMUNITY)
Admission: EM | Admit: 2016-07-13 | Discharge: 2016-07-14 | Disposition: A | Discharge status: Home / Self Care | Payer: Medicare Other | Attending: Emergency Medicine | Admitting: Emergency Medicine

## 2016-07-13 DIAGNOSIS — I10 Essential (primary) hypertension: Secondary | ICD-10-CM | POA: Insufficient documentation

## 2016-07-13 DIAGNOSIS — R197 Diarrhea, unspecified: Secondary | ICD-10-CM | POA: Diagnosis not present

## 2016-07-13 DIAGNOSIS — R112 Nausea with vomiting, unspecified: Secondary | ICD-10-CM

## 2016-07-13 DIAGNOSIS — K2970 Gastritis, unspecified, without bleeding: Secondary | ICD-10-CM | POA: Diagnosis not present

## 2016-07-13 DIAGNOSIS — Z7982 Long term (current) use of aspirin: Secondary | ICD-10-CM | POA: Diagnosis not present

## 2016-07-13 DIAGNOSIS — E860 Dehydration: Secondary | ICD-10-CM | POA: Diagnosis not present

## 2016-07-13 DIAGNOSIS — R6883 Chills (without fever): Secondary | ICD-10-CM | POA: Diagnosis not present

## 2016-07-13 LAB — CBC WITH DIFFERENTIAL/PLATELET
BASOS ABS: 0 10*3/uL (ref 0.0–0.1)
Basophils Relative: 0 %
EOS PCT: 0 %
Eosinophils Absolute: 0 10*3/uL (ref 0.0–0.7)
HCT: 45.1 % (ref 36.0–46.0)
Hemoglobin: 15.8 g/dL — ABNORMAL HIGH (ref 12.0–15.0)
LYMPHS ABS: 0.4 10*3/uL — AB (ref 0.7–4.0)
LYMPHS PCT: 2 %
MCH: 33.5 pg (ref 26.0–34.0)
MCHC: 35 g/dL (ref 30.0–36.0)
MCV: 95.8 fL (ref 78.0–100.0)
MONO ABS: 1.6 10*3/uL — AB (ref 0.1–1.0)
Monocytes Relative: 8 %
Neutro Abs: 18.4 10*3/uL — ABNORMAL HIGH (ref 1.7–7.7)
Neutrophils Relative %: 90 %
PLATELETS: 175 10*3/uL (ref 150–400)
RBC: 4.71 MIL/uL (ref 3.87–5.11)
RDW: 12 % (ref 11.5–15.5)
WBC: 20.4 10*3/uL — ABNORMAL HIGH (ref 4.0–10.5)

## 2016-07-13 MED ORDER — SODIUM CHLORIDE 0.9 % IV BOLUS (SEPSIS)
1000.0000 mL | Freq: Once | INTRAVENOUS | Status: AC
  Administered 2016-07-14: 1000 mL via INTRAVENOUS

## 2016-07-13 MED ORDER — SODIUM CHLORIDE 0.9 % IV BOLUS (SEPSIS)
500.0000 mL | Freq: Once | INTRAVENOUS | Status: AC
  Administered 2016-07-13: 500 mL via INTRAVENOUS

## 2016-07-13 MED ORDER — ONDANSETRON HCL 4 MG/2ML IJ SOLN
4.0000 mg | Freq: Once | INTRAMUSCULAR | Status: AC
  Administered 2016-07-13: 4 mg via INTRAVENOUS
  Filled 2016-07-13: qty 2

## 2016-07-13 NOTE — ED Provider Notes (Signed)
[facility] DEPT Provider Note   CSN: [id number] Arrival date & time: 07/13/16  2309  By signing my name below, I, Irene Pap, attest that this documentation has been prepared under the direction and in the presence of Rolland Porter, MD. Electronically Signed: Irene Pap, ED Scribe. 07/13/16. 11:24 PM.  Time seen 23:10 PM  History   Chief Complaint Chief Complaint  Patient presents with  . Emesis   The history is provided by the patient. No language interpreter was used.   HPI Comments: ORI EHRESMAN is a 82 y.o. Female with a hx of appendectomy, cholecystectomy, HTN, TIA, and IBS brought in by EMS who presents to the Emergency Department complaining of vomiting x20 onset 5 hours ago around 6 pm. She reports associated chills, nausea, and watery diarrhea about the same number of times. Pt says that she would have an episode of diarrhea every time she moved or cough. She was sitting on the toilet for about 4 hours and was unable to leave  the toilet. Pt states that she attempted to eat soup from a can (Onion)  for dinner and the smell made her nauseous. Her symptoms began immediately after. Pt says that she experienced chest discomfort a few nights ago that has since resolved. Pt was concerned due to her past cardiac hx. She denies suspicious food intake, sick contacts, fever, dizziness, lightheadedness, or abdominal pain. She does feel weak.   PCP: Wende Neighbors, MD  Past Medical History:  Diagnosis Date  . Arthritis   . Heart disease   . HTN (hypertension)   . Hyperlipidemia   . Neuropathy ([place])   . Thyroid disease     Patient Active Problem List   Diagnosis Date Noted  . Diarrhea 05/09/2012  . Rotator cuff syndrome of right shoulder 08/03/2011  . Rotator cuff tear, right 08/03/2011  . TIA 03/12/2008  . HIP PAIN, BILATERAL 03/12/2008  . ASTHMATIC BRONCHITIS, ACUTE 10/25/2007  . PERIPHERAL NEUROPATHY 05/03/2007  . DYSEQUILIBRIUM 05/03/2007  . LEG PAIN 04/14/2007  .  ANKLE PAIN, LEFT 03/27/2007  . EDEMA LEG 03/27/2007  . BENIGN POSITIONAL VERTIGO 12/02/2006  . HEADACHE 12/02/2006  . COLONIC POLYPS 10/21/2006  . HYPOTHYROIDISM 10/21/2006  . Dyslipidemia 10/21/2006  . COMMON MIGRAINE 10/21/2006  . Essential hypertension 10/21/2006  . Coronary atherosclerosis 10/21/2006  . ASTHMA 10/21/2006  . IRRITABLE BOWEL SYNDROME 10/21/2006  . FIBROCYSTIC BREAST DISEASE 10/21/2006  . DEGENERATIVE DISC DISEASE 10/21/2006  . LOW BACK PAIN 10/21/2006    Past Surgical History:  Procedure Laterality Date  . APPENDECTOMY    . ARTERIAL BYPASS SURGRY    . BACK SURGERY    . COLONOSCOPY    . LIPOMA EXCISION    . SKULL FRACTURE ELEVATION    . UPPER GASTROINTESTINAL ENDOSCOPY      OB History    No data available       Home Medications    Prior to Admission medications   Medication Sig Start Date End Date Taking? Authorizing Provider  aspirin 81 MG tablet Take 81 mg by mouth daily.      Historical Provider, MD  Coenzyme Q10 (COQ10) 100 MG CAPS Take 1 capsule by mouth daily.     Historical Provider, MD  CRESTOR 10 MG tablet TAKE 1 TABLET BY MOUTH ONCE WEEKLY. 07/09/16   [name], MD  ezetimibe (ZETIA) 10 MG tablet TAKE 1 TABLET BY MOUTH ONCE DAILY FOR CHOLESTEROL. 01/08/16   [name], MD  isosorbide mononitrate (IMDUR) 120 MG  24 hr tablet TAKE 1 TABLET BY MOUTH ONCE DAILY. 08/08/15   [name], MD  levothyroxine (SYNTHROID, LEVOTHROID) 100 MCG tablet Take 50-100 mcg by mouth See admin instructions. Take 1 tablet daily except 1/2 tablet on Wednesday.    Historical Provider, MD  Multiple Vitamins-Minerals (OCUVITE PO) Take 1 capsule by mouth daily.     Historical Provider, MD  NORVASC 5 MG tablet TAKE ONE TABLET BY MOUTH ONCE DAILY. 10/01/15   [name], MD  omeprazole (PRILOSEC) 20 MG capsule Take 20 mg by mouth daily. 09/04/13   Historical Provider, MD  ondansetron (ZOFRAN) 4 MG tablet Take 1 tablet (4 mg total) by mouth every 8 (eight)  hours as needed for nausea or vomiting. 07/14/16   Rolland Porter, MD    Family History Family History  Problem Relation Age of Onset  . Colon cancer Sister   . Ovarian cancer Sister   . Alzheimer's disease Sister   . Colon cancer Sister   . Hypertension Daughter   . GER disease Daughter   . Anxiety disorder Daughter   . Hyperlipidemia Daughter   . Hypertension Son   . Arthritis    . Cancer      Social History Social History  Substance Use Topics  . Smoking status: Never Smoker  . Smokeless tobacco: Never Used  . Alcohol use No  son lives with her Lives a home   Allergies   Codeine; Dicyclomine hcl; Erythromycin; Meclizine hcl; Niacin; and Statins   Review of Systems Review of Systems  Constitutional: Positive for chills. Negative for fever.  Gastrointestinal: Positive for diarrhea, nausea and vomiting. Negative for abdominal pain.  Neurological: Negative for dizziness and light-headedness.  All other systems reviewed and are negative.  Physical Exam Updated Vital Signs BP 136/72 (BP Location: Left Arm)   Pulse 90   Temp 98.1 F (36.7 C) (Oral)   Resp 16   Ht 5' (1.524 m)   Wt 135 lb (61.2 kg)   SpO2 98%   BMI 26.37 kg/m   Vital signs normal    Physical Exam  Constitutional: She is oriented to person, place, and time.  Non-toxic appearance. She does not appear ill. No distress.  Pleasant elderly female, hard of hearing despite hearing aides  HENT:  Head: Normocephalic and atraumatic.  Right Ear: External ear normal.  Left Ear: External ear normal.  Nose: Nose normal. No mucosal edema or rhinorrhea.  Mouth/Throat: Oropharynx is clear and moist. Mucous membranes are dry. No dental abscesses or uvula swelling.  Eyes: Conjunctivae and EOM are normal. Pupils are equal, round, and reactive to light.  Neck: Normal range of motion and full passive range of motion without pain. Neck supple.  Cardiovascular: Normal rate, regular rhythm and normal heart sounds.  Exam  reveals no gallop and no friction rub.   No murmur heard. Pulmonary/Chest: Effort normal and breath sounds normal. No respiratory distress. She has no wheezes. She has no rhonchi. She has no rales. She exhibits no tenderness and no crepitus.  Abdominal: Soft. Normal appearance and bowel sounds are normal. She exhibits no distension. There is tenderness in the right upper quadrant. There is no rebound and no guarding.    Musculoskeletal: Normal range of motion. She exhibits no edema or tenderness.  Moves all extremities well.   Neurological: She is alert and oriented to person, place, and time. She has normal strength. No cranial nerve deficit.  Skin: Skin is warm, dry and intact. No rash noted.  No erythema. There is pallor.  Psychiatric: She has a normal mood and affect. Her speech is normal and behavior is normal. Her mood appears not anxious.  Nursing note and vitals reviewed.    ED Treatments / Results  Labs (all labs ordered are listed, but only abnormal results are displayed) Results for orders placed or performed during the hospital encounter of 07/13/16  Comprehensive metabolic panel  Result Value Ref Range   Sodium 137 135 - 145 mmol/L   Potassium 3.5 3.5 - 5.1 mmol/L   Chloride 110 101 - 111 mmol/L   CO2 20 (L) 22 - 32 mmol/L   Glucose, Bld 195 (H) 65 - 99 mg/dL   BUN 27 (H) 6 - 20 mg/dL   Creatinine, Ser 1.00 0.44 - 1.00 mg/dL   Calcium 8.9 8.9 - 10.3 mg/dL   Total Protein 6.9 6.5 - 8.1 g/dL   Albumin 4.2 3.5 - 5.0 g/dL   AST 25 15 - 41 U/L   ALT 18 14 - 54 U/L   Alkaline Phosphatase 97 38 - 126 U/L   Total Bilirubin 0.9 0.3 - 1.2 mg/dL   GFR calc non Af Amer 47 (L) >60 mL/min   GFR calc Af Amer 54 (L) >60 mL/min   Anion gap 7 5 - 15  CBC with Differential  Result Value Ref Range   WBC 20.4 (H) 4.0 - 10.5 K/uL   RBC 4.71 3.87 - 5.11 MIL/uL   Hemoglobin 15.8 (H) 12.0 - 15.0 g/dL   HCT 45.1 36.0 - 46.0 %   MCV 95.8 78.0 - 100.0 fL   MCH 33.5 26.0 - 34.0 pg   MCHC  35.0 30.0 - 36.0 g/dL   RDW 12.0 11.5 - 15.5 %   Platelets 175 150 - 400 K/uL   Neutrophils Relative % 90 %   Neutro Abs 18.4 (H) 1.7 - 7.7 K/uL   Lymphocytes Relative 2 %   Lymphs Abs 0.4 (L) 0.7 - 4.0 K/uL   Monocytes Relative 8 %   Monocytes Absolute 1.6 (H) 0.1 - 1.0 K/uL   Eosinophils Relative 0 %   Eosinophils Absolute 0.0 0.0 - 0.7 K/uL   Basophils Relative 0 %   Basophils Absolute 0.0 0.0 - 0.1 K/uL  Troponin I  Result Value Ref Range   Troponin I <0.03 <0.03 ng/mL  Lipase, blood  Result Value Ref Range   Lipase 22 11 - 51 U/L  Urinalysis, Routine w reflex microscopic  Result Value Ref Range   Color, Urine BROWN (A) YELLOW   APPearance CLEAR CLEAR   Specific Gravity, Urine 1.025 1.005 - 1.030   pH 6.5 5.0 - 8.0   Glucose, UA NEGATIVE NEGATIVE mg/dL   Hgb urine dipstick NEGATIVE NEGATIVE   Bilirubin Urine LARGE (A) NEGATIVE   Ketones, ur 15 (A) NEGATIVE mg/dL   Protein, ur 30 (A) NEGATIVE mg/dL   Nitrite POSITIVE (A) NEGATIVE   Leukocytes, UA TRACE (A) NEGATIVE  Urine microscopic-add on  Result Value Ref Range   Squamous Epithelial / LPF 0-5 (A) NONE SEEN   WBC, UA 0-5 0 - 5 WBC/hpf   RBC / HPF 0-5 0 - 5 RBC/hpf   Bacteria, UA MANY (A) NONE SEEN   Casts HYALINE CASTS (A) NEGATIVE   Urine-Other MUCOUS PRESENT    Laboratory interpretation all normal except ? UTI, denies UTI symptoms, states her urine was darker than usual tonight. Denies taking any meds for UTI, LFT's are normal including TB. Leukocytosis c/w multiple episodes  of vomiting, elevated BUN c/w dehydration    EKG  EKG Interpretation None       Radiology No results found.  Procedures Procedures (including critical care time)  Medications Ordered in ED Medications  sodium chloride 0.9 % bolus 1,000 mL (0 mLs Intravenous Stopped 07/14/16 0416)  sodium chloride 0.9 % bolus 500 mL (0 mLs Intravenous Stopped 07/14/16 0219)  ondansetron (ZOFRAN) injection 4 mg (4 mg Intravenous Given 07/13/16 2354)     Initial Impression / Assessment and Plan / ED Course   I have reviewed the triage vital signs and the nursing notes.  Pertinent labs & imaging results that were available during my care of the patient were reviewed by me and considered in my medical decision making (see chart for details).  Clinical Course   COORDINATION OF CARE: 11:24 PM-Discussed treatment plan which includes labs with pt at bedside and pt agreed to plan.   12:57 AM- pt reports relief with IV fluids and Zofran. She states that the bowel movement she had in the ED is less watery and getting formed  than earlier. She feels ready to try oral fluids.   Recheck at 03:30 AM feeling better, drinking fluids. Had a lot of dark urine output. No more diarrhea. Feels ready to be discharged. We discussed her urinalysis however she denies any urinary symptoms. She states she has a hard time with antibiotics. Urine culture was sent. She is to call Dr. Juel Burrow office and have them check the urine culture results in 2 days. She was advised to continue a clear liquid diet this morning if she is doing well this afternoon and advance to bland diet. She should avoid milk products until the diarrhea is gone. She states she has Imodium at home to take if she continues to have diarrhea.   Final Clinical Impressions(s) / ED Diagnoses   Final diagnoses:  Nausea vomiting and diarrhea  Dehydration    New Prescriptions Discharge Medication List as of 07/14/2016  3:59 AM    START taking these medications   Details  ondansetron (ZOFRAN) 4 MG tablet Take 1 tablet (4 mg total) by mouth every 8 (eight) hours as needed for nausea or vomiting., Starting Wed 07/14/2016, Print        Plan discharge  Rolland Porter, MD, FACEP  I personally performed the services described in this documentation, which was scribed in my presence. The recorded information has been reviewed and considered.  Rolland Porter, MD, Barbette Or, MD 07/14/16 [phone]

## 2016-07-13 NOTE — ED Triage Notes (Signed)
Pt c/o vomiting that started today around 1700; pt was given 4mg  zofran en route by [facility]; cbg 221; O2 sats initially 93%, O2 placed on pt en route and sats up to upper 90's

## 2016-07-14 DIAGNOSIS — R112 Nausea with vomiting, unspecified: Secondary | ICD-10-CM | POA: Diagnosis not present

## 2016-07-14 LAB — URINE MICROSCOPIC-ADD ON

## 2016-07-14 LAB — COMPREHENSIVE METABOLIC PANEL
ALK PHOS: 97 U/L (ref 38–126)
ALT: 18 U/L (ref 14–54)
ANION GAP: 7 (ref 5–15)
AST: 25 U/L (ref 15–41)
Albumin: 4.2 g/dL (ref 3.5–5.0)
BILIRUBIN TOTAL: 0.9 mg/dL (ref 0.3–1.2)
BUN: 27 mg/dL — ABNORMAL HIGH (ref 6–20)
CALCIUM: 8.9 mg/dL (ref 8.9–10.3)
CO2: 20 mmol/L — ABNORMAL LOW (ref 22–32)
CREATININE: 1 mg/dL (ref 0.44–1.00)
Chloride: 110 mmol/L (ref 101–111)
GFR, EST AFRICAN AMERICAN: 54 mL/min — AB (ref >60)
GFR, EST NON AFRICAN AMERICAN: 47 mL/min — AB (ref >60)
Glucose, Bld: 195 mg/dL — ABNORMAL HIGH (ref 65–99)
Potassium: 3.5 mmol/L (ref 3.5–5.1)
Sodium: 137 mmol/L (ref 135–145)
TOTAL PROTEIN: 6.9 g/dL (ref 6.5–8.1)

## 2016-07-14 LAB — URINALYSIS, ROUTINE W REFLEX MICROSCOPIC
GLUCOSE, UA: NEGATIVE mg/dL
HGB URINE DIPSTICK: NEGATIVE
Ketones, ur: 15 mg/dL — AB
Nitrite: POSITIVE — AB
PROTEIN: 30 mg/dL — AB
Specific Gravity, Urine: 1.025 (ref 1.005–1.030)
pH: 6.5 (ref 5.0–8.0)

## 2016-07-14 LAB — LIPASE, BLOOD: LIPASE: 22 U/L (ref 11–51)

## 2016-07-14 LAB — TROPONIN I: Troponin I: <0.03 ng/mL (ref <0.03)

## 2016-07-14 MED ORDER — ONDANSETRON HCL 4 MG PO TABS: 4.0000 mg | ORAL_TABLET | Freq: Three times a day (TID) | ORAL | 0 refills | Status: DC | PRN

## 2016-07-14 NOTE — Discharge Instructions (Signed)
Drink plenty of fluids (clear liquids) this morning and then use a bland diet such as toast, crackers, scrambled eggs, Jell-O, or [employer] chicken noodle soup.. Use the zofran for nausea or vomiting. Take imodium OTC for diarrhea. Avoid mild products until the diarrhea is gone. Recheck if you get worse.

## 2016-07-15 LAB — URINE CULTURE

## 2016-07-16 ENCOUNTER — Other Ambulatory Visit (HOSPITAL_COMMUNITY): Payer: Self-pay | Admitting: Internal Medicine

## 2016-07-16 DIAGNOSIS — Z1231 Encounter for screening mammogram for malignant neoplasm of breast: Secondary | ICD-10-CM

## 2016-07-23 ENCOUNTER — Ambulatory Visit (HOSPITAL_COMMUNITY)
Admission: RE | Admit: 2016-07-23 | Discharge: 2016-07-23 | Disposition: A | Discharge status: Home / Self Care | Payer: Medicare Other | Source: Ambulatory Visit | Attending: Internal Medicine | Admitting: Internal Medicine

## 2016-07-23 DIAGNOSIS — Z1231 Encounter for screening mammogram for malignant neoplasm of breast: Secondary | ICD-10-CM | POA: Diagnosis not present

## 2016-08-09 ENCOUNTER — Encounter: Payer: Self-pay | Admitting: Cardiology

## 2016-08-09 NOTE — Progress Notes (Signed)
Cardiology Office Note    Date:  08/10/2016   ID:  Christine Juarez, DOB 1920/06/29, MRN [id number]  PCP:  Wende Neighbors, MD  Cardiologist:  Fransico Him, MD   Chief Complaint  Patient presents with  . Coronary Artery Disease  . Hypertension  . Hyperlipidemia    History of Present Illness:  Christine Juarez is a 82 y.o. female with a history of ASCAD, HTN, and dyslipidemia who presents today for followup. She is doing well. She denies any SOB, DOE palpitations or syncope.  She occasionally has some mild LE edema if she has eaten salt.  She does have problems with LE neuropathy.  She has had some chest discomfort that feels like indigestion that only lasts a few seconds at a time.  It is nonexertional with no SOB, nausea or diaphoresis.    Past Medical History:  Diagnosis Date  . Aortic stenosis    mild by echo 08/2015  . Arthritis   . CAD (coronary artery disease), native coronary artery    s/p remote CABG  . HTN (hypertension)   . Hyperlipidemia   . Neuropathy ([place])   . Thyroid disease     Past Surgical History:  Procedure Laterality Date  . APPENDECTOMY    . ARTERIAL BYPASS SURGRY    . BACK SURGERY    . COLONOSCOPY    . LIPOMA EXCISION    . SKULL FRACTURE ELEVATION    . UPPER GASTROINTESTINAL ENDOSCOPY      Current Medications: Outpatient Medications Prior to Visit  Medication Sig Dispense Refill  . aspirin 81 MG tablet Take 81 mg by mouth daily.      . Coenzyme Q10 (COQ10) 100 MG CAPS Take 1 capsule by mouth daily.     . CRESTOR 10 MG tablet TAKE 1 TABLET BY MOUTH ONCE WEEKLY. 12 tablet 0  . ezetimibe (ZETIA) 10 MG tablet TAKE 1 TABLET BY MOUTH ONCE DAILY FOR CHOLESTEROL. 30 tablet 9  . isosorbide mononitrate (IMDUR) 120 MG 24 hr tablet TAKE 1 TABLET BY MOUTH ONCE DAILY. 30 tablet 11  . levothyroxine (SYNTHROID, LEVOTHROID) 100 MCG tablet Take 50-100 mcg by mouth See admin instructions. Take 1 tablet daily except 1/2 tablet on Wednesday.    . NORVASC 5 MG tablet TAKE  ONE TABLET BY MOUTH ONCE DAILY. 30 tablet 10  . omeprazole (PRILOSEC) 20 MG capsule Take 20 mg by mouth daily.    . Multiple Vitamins-Minerals (OCUVITE PO) Take 1 capsule by mouth daily.     . ondansetron (ZOFRAN) 4 MG tablet Take 1 tablet (4 mg total) by mouth every 8 (eight) hours as needed for nausea or vomiting. (Patient not taking: Reported on 08/10/2016) 10 tablet 0   No facility-administered medications prior to visit.      Allergies:   Codeine; Dicyclomine hcl; Erythromycin; Meclizine hcl; Niacin; and Statins   Social History   Social History  . Marital status: Widowed    Spouse name: N/A  . Number of children: N/A  . Years of education: 44   Social History Main Topics  . Smoking status: Never Smoker  . Smokeless tobacco: Never Used  . Alcohol use No  . Drug use: Unknown  . Sexual activity: Not Asked   Other Topics Concern  . None   Social History Narrative  . None     Family History:  The patient's family history includes Alzheimer's disease in her sister; Anxiety disorder in her daughter; Colon cancer in her sister and  sister; GER disease in her daughter; Hyperlipidemia in her daughter; Hypertension in her daughter and son; Ovarian cancer in her sister.   ROS:   Please see the history of present illness.    ROS All other systems reviewed and are negative.  No flowsheet data found.     PHYSICAL EXAM:   VS:  BP 122/68   Pulse 80   Ht 5' (1.524 m)   Wt 129 lb (58.5 kg)   SpO2 94%   BMI 25.19 kg/m    GEN: Well nourished, well developed, in no acute distress  HEENT: normal  Neck: no JVD, carotid bruits, or masses Cardiac: RRR; norubs, or gallops,no edema.  Intact distal pulses bilaterally. 2/6 SM at RUSB Respiratory:  clear to auscultation bilaterally, normal work of breathing GI: soft, nontender, nondistended, + BS MS: no deformity or atrophy  Skin: warm and dry, no rash Neuro:  Alert and Oriented x 3, Strength and sensation are intact Psych: euthymic  mood, full affect  Wt Readings from Last 3 Encounters:  08/10/16 129 lb (58.5 kg)  07/13/16 135 lb (61.2 kg)  03/07/16 135 lb (61.2 kg)      Studies/Labs Reviewed:   EKG:  EKG is ordered today.  The ekg ordered today demonstrates   Recent Labs: 07/13/2016: ALT 18; BUN 27; Creatinine, Ser 1.00; Hemoglobin 15.8; Platelets 175; Potassium 3.5; Sodium 137   Lipid Panel    Component Value Date/Time   CHOL 160 01/27/2015 1033   TRIG 105.0 01/27/2015 1033   HDL 56.90 01/27/2015 1033   CHOLHDL 3 01/27/2015 1033   VLDL 21.0 01/27/2015 1033   LDLCALC 82 01/27/2015 1033    Additional studies/ records that were reviewed today include:  none    ASSESSMENT:    1. Atherosclerosis of native coronary artery of native heart without angina pectoris   2. Essential hypertension   3. Dyslipidemia   4. Aortic valve stenosis, etiology of cardiac valve disease unspecified      PLAN:  In order of problems listed above:  1. ASCAD s/p remote CABG with no angina - continue ASA, statin.  She has been having headaches for a year and is concerned that it might be the imdur.  I have recommended that we decrease Imdur to 60mg  daily and see if HA resolves.  She will call in a week to let me know if HA improves.  I have also asked her to call if she starts getting angina after cutting back on the nitrate. 2. HTN - BP controlled on current meds.   Continue amlodipine. 3. Hyperlipidemia with LDL goal < 70 - continue zetia/statin. LDL 66. 4. Mild AS by echo 08/2015.  Will repeat echo to assess for progression.     Medication Adjustments/Labs and Tests Ordered: Current medicines are reviewed at length with the patient today.  Concerns regarding medicines are outlined above.  Medication changes, Labs and Tests ordered today are listed in the Patient Instructions below.  There are no Patient Instructions on file for this visit.   Signed, Fransico Him, MD  08/10/2016 3:21 PM    [facility] [place], [place], [place]  [address] Phone: [phone]; Fax: [phone]

## 2016-08-10 ENCOUNTER — Encounter: Payer: Self-pay | Admitting: Cardiology

## 2016-08-10 ENCOUNTER — Ambulatory Visit (INDEPENDENT_AMBULATORY_CARE_PROVIDER_SITE_OTHER): Payer: Medicare Other | Admitting: Cardiology

## 2016-08-10 VITALS — BP 122/68 | HR 80 | Ht 60.0 in | Wt 129.0 lb

## 2016-08-10 DIAGNOSIS — E785 Hyperlipidemia, unspecified: Secondary | ICD-10-CM | POA: Diagnosis not present

## 2016-08-10 DIAGNOSIS — I2510 Atherosclerotic heart disease of native coronary artery without angina pectoris: Secondary | ICD-10-CM

## 2016-08-10 DIAGNOSIS — I350 Nonrheumatic aortic (valve) stenosis: Secondary | ICD-10-CM | POA: Diagnosis not present

## 2016-08-10 DIAGNOSIS — I10 Essential (primary) hypertension: Secondary | ICD-10-CM | POA: Diagnosis not present

## 2016-08-10 MED ORDER — ISOSORBIDE MONONITRATE ER 60 MG PO TB24: 60.0000 mg | ORAL_TABLET | Freq: Every day | ORAL | 11 refills | Status: DC

## 2016-08-10 NOTE — Patient Instructions (Signed)
Medication Instructions:  1) DECREASE IMDUR to 60 mg daily  Labwork: None  Testing/Procedures: Your physician has requested that you have an echocardiogram. Echocardiography is a painless test that uses sound waves to create images of your heart. It provides your doctor with information about the size and shape of your heart and how well your heart's chambers and valves are working. This procedure takes approximately one hour. There are no restrictions for this procedure.  Follow-Up: Your physician wants you to follow-up in: 1 year with Dr. Radford Pax. You will receive a reminder letter in the mail two months in advance. If you don't receive a letter, please call our office to schedule the follow-up appointment.   Any Other Special Instructions Will Be Listed Below (If Applicable).     If you need a refill on your cardiac medications before your next appointment, please call your pharmacy.

## 2016-08-31 ENCOUNTER — Ambulatory Visit (HOSPITAL_COMMUNITY): Payer: Medicare Other | Attending: Cardiovascular Disease

## 2016-08-31 ENCOUNTER — Other Ambulatory Visit: Payer: Self-pay

## 2016-08-31 ENCOUNTER — Other Ambulatory Visit (HOSPITAL_COMMUNITY): Payer: Medicare Other

## 2016-08-31 DIAGNOSIS — I10 Essential (primary) hypertension: Secondary | ICD-10-CM | POA: Insufficient documentation

## 2016-08-31 DIAGNOSIS — Z8673 Personal history of transient ischemic attack (TIA), and cerebral infarction without residual deficits: Secondary | ICD-10-CM | POA: Diagnosis not present

## 2016-08-31 DIAGNOSIS — I350 Nonrheumatic aortic (valve) stenosis: Secondary | ICD-10-CM | POA: Diagnosis not present

## 2016-08-31 DIAGNOSIS — E785 Hyperlipidemia, unspecified: Secondary | ICD-10-CM | POA: Insufficient documentation

## 2016-09-01 ENCOUNTER — Telehealth: Payer: Self-pay | Admitting: Cardiology

## 2016-09-01 NOTE — Telephone Encounter (Signed)
Informed patient of results and verbal understanding expressed.  

## 2016-09-01 NOTE — Telephone Encounter (Signed)
Follow Up:; ° ° °Returning your call. °

## 2016-09-01 NOTE — Telephone Encounter (Signed)
-----   Message from [name], MD sent at 09/01/2016 12:29 PM EST ----- Echo showed normal LVF with mild AS

## 2016-09-15 ENCOUNTER — Ambulatory Visit (HOSPITAL_COMMUNITY)
Admission: RE | Admit: 2016-09-15 | Discharge: 2016-09-15 | Disposition: A | Discharge status: Home / Self Care | Payer: Medicare Other | Source: Ambulatory Visit | Attending: Internal Medicine | Admitting: Internal Medicine

## 2016-09-15 ENCOUNTER — Other Ambulatory Visit (HOSPITAL_COMMUNITY): Payer: Self-pay | Admitting: Internal Medicine

## 2016-09-15 DIAGNOSIS — R52 Pain, unspecified: Secondary | ICD-10-CM

## 2016-09-15 DIAGNOSIS — W19XXXA Unspecified fall, initial encounter: Secondary | ICD-10-CM

## 2016-09-15 DIAGNOSIS — S8991XA Unspecified injury of right lower leg, initial encounter: Secondary | ICD-10-CM | POA: Diagnosis not present

## 2016-09-15 DIAGNOSIS — Z9181 History of falling: Secondary | ICD-10-CM | POA: Diagnosis not present

## 2016-09-15 DIAGNOSIS — M25561 Pain in right knee: Secondary | ICD-10-CM | POA: Diagnosis not present

## 2016-09-30 ENCOUNTER — Other Ambulatory Visit: Payer: Self-pay | Admitting: Cardiology

## 2016-10-05 DIAGNOSIS — H35363 Drusen (degenerative) of macula, bilateral: Secondary | ICD-10-CM | POA: Diagnosis not present

## 2016-12-20 ENCOUNTER — Other Ambulatory Visit: Payer: Self-pay | Admitting: Cardiology

## 2016-12-30 ENCOUNTER — Other Ambulatory Visit: Payer: Self-pay | Admitting: Cardiology

## 2016-12-30 MED ORDER — NORVASC 5 MG PO TABS: 5.0000 mg | ORAL_TABLET | Freq: Every day | ORAL | 10 refills | Status: DC

## 2016-12-30 MED ORDER — CRESTOR 10 MG PO TABS: 10.0000 mg | ORAL_TABLET | ORAL | 2 refills | Status: DC

## 2016-12-30 NOTE — Telephone Encounter (Signed)
Norvasc and Crestor called in DAW. Patient's daughter notified. She was grateful for assistance.

## 2016-12-30 NOTE — Telephone Encounter (Signed)
Pt needs her Norvasc and Crestor prescription saying brand name only please. This is the only way her insurance will pay for it.If daughter does not answer on work phone,please call on her cell phone-[phone].

## 2017-05-06 ENCOUNTER — Other Ambulatory Visit: Payer: Self-pay

## 2017-06-14 ENCOUNTER — Telehealth: Payer: Self-pay | Admitting: Cardiology

## 2017-06-14 MED ORDER — EZETIMIBE 10 MG PO TABS: 10.0000 mg | ORAL_TABLET | Freq: Every day | ORAL | 5 refills | Status: DC

## 2017-06-14 NOTE — Telephone Encounter (Signed)
DAW rx sent in.

## 2017-06-14 NOTE — Telephone Encounter (Signed)
Leda Gauze (daughter) is calling to see if her mother can get the name brand for Zeita called in , because the generic (Ezetimibe -oral) is giving her a lot of little problems since she has been taking the generic form . Also call Mrs. Bolds if you have any questions  Please call.

## 2017-07-07 ENCOUNTER — Telehealth: Payer: Self-pay | Admitting: Cardiology

## 2017-07-07 NOTE — Telephone Encounter (Signed)
New Message     Please fax orders for her yearly blood work and for her thyroid  to Dr Wende Neighbors office [phone] fax

## 2017-07-07 NOTE — Telephone Encounter (Signed)
I spoke with pt's daughter. Pt is going to have lab work done at primary care prior to office visit with Dr. Radford Pax in October.  Daughter is asking what lab work Dr. Radford Pax would like done.  I told daughter to have primary care check lipid and liver profiles with upcoming lab work.  Daughter will contact primary care to schedule lab work and have it faxed to our office prior to appt.

## 2017-07-08 ENCOUNTER — Other Ambulatory Visit: Payer: Self-pay | Admitting: Cardiology

## 2017-07-13 DIAGNOSIS — E782 Mixed hyperlipidemia: Secondary | ICD-10-CM | POA: Diagnosis not present

## 2017-07-13 DIAGNOSIS — E039 Hypothyroidism, unspecified: Secondary | ICD-10-CM | POA: Diagnosis not present

## 2017-07-13 DIAGNOSIS — R7301 Impaired fasting glucose: Secondary | ICD-10-CM | POA: Diagnosis not present

## 2017-08-10 ENCOUNTER — Encounter: Payer: Self-pay | Admitting: Cardiology

## 2017-08-10 ENCOUNTER — Ambulatory Visit (INDEPENDENT_AMBULATORY_CARE_PROVIDER_SITE_OTHER): Payer: Medicare Other | Admitting: Cardiology

## 2017-08-10 VITALS — BP 136/74 | HR 76 | Ht 59.0 in | Wt 131.2 lb

## 2017-08-10 DIAGNOSIS — E785 Hyperlipidemia, unspecified: Secondary | ICD-10-CM

## 2017-08-10 DIAGNOSIS — I350 Nonrheumatic aortic (valve) stenosis: Secondary | ICD-10-CM | POA: Diagnosis not present

## 2017-08-10 DIAGNOSIS — I2510 Atherosclerotic heart disease of native coronary artery without angina pectoris: Secondary | ICD-10-CM | POA: Diagnosis not present

## 2017-08-10 DIAGNOSIS — I10 Essential (primary) hypertension: Secondary | ICD-10-CM | POA: Diagnosis not present

## 2017-08-10 NOTE — Patient Instructions (Signed)
Medication Instructions:  Your physician has recommended you make the following change in your medication:   HOLD: crestor for 2 weeks and call office with an update on symptoms   Labwork: None ordered  Testing/Procedures: Non ordered  Follow-Up: Your physician wants you to follow-up in: 1 year with Dr. Radford Pax. You will receive a reminder letter in the mail two months in advance. If you don't receive a letter, please call our office to schedule the follow-up appointment.   Any Other Special Instructions Will Be Listed Below (If Applicable).     If you need a refill on your cardiac medications before your next appointment, please call your pharmacy.

## 2017-08-10 NOTE — Progress Notes (Signed)
Cardiology Office Note:    Date:  08/10/2017   ID:  Christine Juarez, DOB 09-Mar-1920, MRN [id number]  PCP:  Celene Squibb, MD  Cardiologist:  Fransico Him, MD   Referring MD: Celene Squibb, MD   Chief Complaint  Patient presents with  . Coronary Artery Disease  . Hypertension  . Hyperlipidemia    History of Present Illness:    Christine Juarez is a 83 y.o. female with a hx of ASCAD s/p remote CABG, HTN, and dyslipidemia.  She is here today for followup and is doing well.  She denies any chest pain or pressure, SOB, DOE, PND, orthopnea, dizziness, palpitations or syncope. She says that when she bends over to tie her shoes she will get SOB but does not get SOB at any other time. She has chronic LE edema which is stable.  She is 89 and looks 57 year younger!.  She is still very active and goes out shopping with her daughter without any problems.  She says that she is slow but that is her only problems.  She still drives. She is compliant with her meds and is tolerating meds with no SE.    Past Medical History:  Diagnosis Date  . Aortic stenosis    mild by echo 08/2015  . Arthritis   . CAD (coronary artery disease), native coronary artery    s/p remote CABG  . HTN (hypertension)   . Hyperlipidemia   . Neuropathy   . Thyroid disease     Past Surgical History:  Procedure Laterality Date  . APPENDECTOMY    . ARTERIAL BYPASS SURGRY    . BACK SURGERY    . COLONOSCOPY    . LIPOMA EXCISION    . SKULL FRACTURE ELEVATION    . UPPER GASTROINTESTINAL ENDOSCOPY      Current Medications: Current Meds  Medication Sig  . aspirin 81 MG tablet Take 81 mg by mouth daily.    . Coenzyme Q10 (COQ10) 100 MG CAPS Take 1 capsule by mouth daily.   . CRESTOR 10 MG tablet Take 1 tablet (10 mg total) by mouth once a week.  . ezetimibe (ZETIA) 10 MG tablet Take 1 tablet (10 mg total) by mouth daily.  . isosorbide mononitrate (IMDUR) 60 MG 24 hr tablet TAKE 1 TABLET BY MOUTH ONCE DAILY.  . Multiple  Vitamins-Minerals (OCUVITE PO) Take 1 tablet by mouth daily.  . NORVASC 5 MG tablet Take 1 tablet (5 mg total) by mouth daily.  Marland Kitchen omeprazole (PRILOSEC) 20 MG capsule Take 20 mg by mouth daily.  Marland Kitchen SYNTHROID 88 MCG tablet Take 88 mcg by mouth daily before breakfast.     Allergies:   Codeine; Dicyclomine hcl; Erythromycin; Meclizine hcl; Niacin; and Statins   Social History   Social History  . Marital status: Widowed    Spouse name: N/A  . Number of children: N/A  . Years of education: 83   Social History Main Topics  . Smoking status: Never Smoker  . Smokeless tobacco: Never Used  . Alcohol use No  . Drug use: Unknown  . Sexual activity: Not Asked   Other Topics Concern  . None   Social History Narrative  . None     Family History: The patient's family history includes Alzheimer's disease in her sister; Anxiety disorder in her daughter; Arthritis in her unknown relative; Cancer in her unknown relative; Colon cancer in her sister and sister; GER disease in her daughter; Hyperlipidemia in her  daughter; Hypertension in her daughter and son; Ovarian cancer in her sister.  ROS:   Please see the history of present illness.    ROS  All other systems reviewed and negative.   EKGs/Labs/Other Studies Reviewed:    The following studies were reviewed today: none  EKG:  EKG is  ordered today.  The ekg ordered today demonstrates NSR with nonspecific ST/T wave abnormality, septal infarct  Recent Labs: No results found for requested labs within last 8760 hours.   Recent Lipid Panel    Component Value Date/Time   CHOL 160 01/27/2015 1033   TRIG 105.0 01/27/2015 1033   HDL 56.90 01/27/2015 1033   CHOLHDL 3 01/27/2015 1033   VLDL 21.0 01/27/2015 1033   LDLCALC 82 01/27/2015 1033    Physical Exam:    VS:  BP 136/74 (BP Location: Left Arm)   Pulse 76   Ht 4\' 11"  (1.499 m)   Wt 131 lb 3.2 oz (59.5 kg)   BMI 26.50 kg/m     Wt Readings from Last 3 Encounters:  08/10/17 131  lb 3.2 oz (59.5 kg)  08/10/16 129 lb (58.5 kg)  07/13/16 135 lb (61.2 kg)     GEN:  Well nourished, well developed in no acute distress HEENT: Normal NECK: No JVD; No carotid bruits LYMPHATICS: No lymphadenopathy CARDIAC: RRR, no rubs, gallops.  2/6 SM at RUSB early to mid peaking RESPIRATORY:  Clear to auscultation without rales, wheezing or rhonchi  ABDOMEN: Soft, non-tender, non-distended MUSCULOSKELETAL:  No edema; No deformity  SKIN: Warm and dry NEUROLOGIC:  Alert and oriented x 3 PSYCHIATRIC:  Normal affect   ASSESSMENT:    1. Atherosclerosis of native coronary artery of native heart without angina pectoris   2. Essential hypertension   3. Nonrheumatic aortic valve stenosis   4. Dyslipidemia    PLAN:    In order of problems listed above:  1.  ASCAD - s/p CABG with no anginal symptoms.  She is very active with no symptoms.  She will continue on ASA 81mg  daily, Imdur 60mg  daily.  She is worried that she may be having leg cramps from her statin so I asked her to stop her statin for 2 weeks and call in a few weeks to let me know if her cramps went away.   2.  HTN - BP is well controlled.  She will continue on amlodipnie 5mg  daily.    3.  Mild to Moderate AS with mean AV gradient a year ago 15mmHg.  She is asymptomatic.    4.  Dyslipidemia with LDL goal < 70. She is having some muslce cramps so we will hold the Crestor and she will call in about 2-3 weeks and let me know if her sx have improved.  She will continue on Zetia.  Her LDL is 72. checked 07/13/2017   Medication Adjustments/Labs and Tests Ordered: Current medicines are reviewed at length with the patient today.  Concerns regarding medicines are outlined above.  No orders of the defined types were placed in this encounter.  No orders of the defined types were placed in this encounter.   Signed, Fransico Him, MD  08/10/2017 3:09 PM    [place]

## 2017-08-24 DIAGNOSIS — R7301 Impaired fasting glucose: Secondary | ICD-10-CM | POA: Diagnosis not present

## 2017-08-24 DIAGNOSIS — E039 Hypothyroidism, unspecified: Secondary | ICD-10-CM | POA: Diagnosis not present

## 2017-08-24 DIAGNOSIS — E782 Mixed hyperlipidemia: Secondary | ICD-10-CM | POA: Diagnosis not present

## 2017-08-26 DIAGNOSIS — I10 Essential (primary) hypertension: Secondary | ICD-10-CM | POA: Diagnosis not present

## 2017-08-26 DIAGNOSIS — G9009 Other idiopathic peripheral autonomic neuropathy: Secondary | ICD-10-CM | POA: Diagnosis not present

## 2017-08-26 DIAGNOSIS — M79606 Pain in leg, unspecified: Secondary | ICD-10-CM | POA: Diagnosis not present

## 2017-08-26 DIAGNOSIS — Z6824 Body mass index (BMI) 24.0-24.9, adult: Secondary | ICD-10-CM | POA: Diagnosis not present

## 2017-08-26 DIAGNOSIS — I25119 Atherosclerotic heart disease of native coronary artery with unspecified angina pectoris: Secondary | ICD-10-CM | POA: Diagnosis not present

## 2017-08-26 DIAGNOSIS — Z0000 Encounter for general adult medical examination without abnormal findings: Secondary | ICD-10-CM | POA: Diagnosis not present

## 2017-08-26 DIAGNOSIS — E782 Mixed hyperlipidemia: Secondary | ICD-10-CM | POA: Diagnosis not present

## 2017-08-26 DIAGNOSIS — E039 Hypothyroidism, unspecified: Secondary | ICD-10-CM | POA: Diagnosis not present

## 2017-08-26 DIAGNOSIS — R3981 Functional urinary incontinence: Secondary | ICD-10-CM | POA: Diagnosis not present

## 2017-08-26 DIAGNOSIS — M545 Low back pain: Secondary | ICD-10-CM | POA: Diagnosis not present

## 2017-10-03 ENCOUNTER — Other Ambulatory Visit (HOSPITAL_COMMUNITY): Payer: Self-pay | Admitting: Internal Medicine

## 2017-10-03 DIAGNOSIS — Z1231 Encounter for screening mammogram for malignant neoplasm of breast: Secondary | ICD-10-CM

## 2017-10-07 ENCOUNTER — Encounter (HOSPITAL_COMMUNITY): Payer: Self-pay

## 2017-10-07 ENCOUNTER — Ambulatory Visit (HOSPITAL_COMMUNITY)
Admission: RE | Admit: 2017-10-07 | Discharge: 2017-10-07 | Disposition: A | Discharge status: Home / Self Care | Payer: Medicare Other | Source: Ambulatory Visit | Attending: Internal Medicine | Admitting: Internal Medicine

## 2017-10-07 DIAGNOSIS — Z1231 Encounter for screening mammogram for malignant neoplasm of breast: Secondary | ICD-10-CM | POA: Diagnosis not present

## 2017-10-10 ENCOUNTER — Other Ambulatory Visit: Payer: Self-pay | Admitting: Cardiology

## 2017-12-13 ENCOUNTER — Other Ambulatory Visit: Payer: Self-pay | Admitting: Cardiology

## 2017-12-22 ENCOUNTER — Other Ambulatory Visit: Payer: Self-pay | Admitting: Cardiology

## 2018-02-22 DIAGNOSIS — I10 Essential (primary) hypertension: Secondary | ICD-10-CM | POA: Diagnosis not present

## 2018-02-22 DIAGNOSIS — E782 Mixed hyperlipidemia: Secondary | ICD-10-CM | POA: Diagnosis not present

## 2018-02-24 DIAGNOSIS — I2510 Atherosclerotic heart disease of native coronary artery without angina pectoris: Secondary | ICD-10-CM | POA: Diagnosis not present

## 2018-02-24 DIAGNOSIS — E8809 Other disorders of plasma-protein metabolism, not elsewhere classified: Secondary | ICD-10-CM | POA: Diagnosis not present

## 2018-02-24 DIAGNOSIS — Z6825 Body mass index (BMI) 25.0-25.9, adult: Secondary | ICD-10-CM | POA: Diagnosis not present

## 2018-02-24 DIAGNOSIS — E039 Hypothyroidism, unspecified: Secondary | ICD-10-CM | POA: Diagnosis not present

## 2018-02-24 DIAGNOSIS — I10 Essential (primary) hypertension: Secondary | ICD-10-CM | POA: Diagnosis not present

## 2018-02-24 DIAGNOSIS — G9009 Other idiopathic peripheral autonomic neuropathy: Secondary | ICD-10-CM | POA: Diagnosis not present

## 2018-02-24 DIAGNOSIS — M545 Low back pain: Secondary | ICD-10-CM | POA: Diagnosis not present

## 2018-02-24 DIAGNOSIS — E782 Mixed hyperlipidemia: Secondary | ICD-10-CM | POA: Diagnosis not present

## 2018-02-24 DIAGNOSIS — Z9181 History of falling: Secondary | ICD-10-CM | POA: Diagnosis not present

## 2018-02-24 DIAGNOSIS — R32 Unspecified urinary incontinence: Secondary | ICD-10-CM | POA: Diagnosis not present

## 2018-02-24 DIAGNOSIS — M79606 Pain in leg, unspecified: Secondary | ICD-10-CM | POA: Diagnosis not present

## 2018-03-23 ENCOUNTER — Other Ambulatory Visit: Payer: Self-pay

## 2018-03-23 ENCOUNTER — Emergency Department (HOSPITAL_COMMUNITY): Payer: Medicare Other

## 2018-03-23 ENCOUNTER — Encounter (HOSPITAL_COMMUNITY): Payer: Self-pay

## 2018-03-23 ENCOUNTER — Inpatient Hospital Stay (HOSPITAL_COMMUNITY)
Admission: EM | Admit: 2018-03-23 | Discharge: 2018-03-27 | DRG: 535 | Disposition: A | Discharge status: Skilled Nursing Facility | Payer: Medicare Other | Attending: Internal Medicine | Admitting: Internal Medicine

## 2018-03-23 DIAGNOSIS — I2510 Atherosclerotic heart disease of native coronary artery without angina pectoris: Secondary | ICD-10-CM | POA: Diagnosis not present

## 2018-03-23 DIAGNOSIS — J479 Bronchiectasis, uncomplicated: Secondary | ICD-10-CM | POA: Diagnosis present

## 2018-03-23 DIAGNOSIS — Z8249 Family history of ischemic heart disease and other diseases of the circulatory system: Secondary | ICD-10-CM

## 2018-03-23 DIAGNOSIS — E039 Hypothyroidism, unspecified: Secondary | ICD-10-CM | POA: Diagnosis present

## 2018-03-23 DIAGNOSIS — I10 Essential (primary) hypertension: Secondary | ICD-10-CM | POA: Diagnosis present

## 2018-03-23 DIAGNOSIS — Z951 Presence of aortocoronary bypass graft: Secondary | ICD-10-CM

## 2018-03-23 DIAGNOSIS — M25552 Pain in left hip: Secondary | ICD-10-CM | POA: Diagnosis not present

## 2018-03-23 DIAGNOSIS — D696 Thrombocytopenia, unspecified: Secondary | ICD-10-CM | POA: Diagnosis present

## 2018-03-23 DIAGNOSIS — W010XXA Fall on same level from slipping, tripping and stumbling without subsequent striking against object, initial encounter: Secondary | ICD-10-CM | POA: Diagnosis present

## 2018-03-23 DIAGNOSIS — E785 Hyperlipidemia, unspecified: Secondary | ICD-10-CM | POA: Diagnosis present

## 2018-03-23 DIAGNOSIS — J849 Interstitial pulmonary disease, unspecified: Secondary | ICD-10-CM | POA: Diagnosis present

## 2018-03-23 DIAGNOSIS — J9601 Acute respiratory failure with hypoxia: Secondary | ICD-10-CM | POA: Diagnosis present

## 2018-03-23 DIAGNOSIS — S32502A Unspecified fracture of left pubis, initial encounter for closed fracture: Secondary | ICD-10-CM | POA: Diagnosis not present

## 2018-03-23 DIAGNOSIS — R102 Pelvic and perineal pain: Secondary | ICD-10-CM | POA: Diagnosis not present

## 2018-03-23 DIAGNOSIS — R52 Pain, unspecified: Secondary | ICD-10-CM | POA: Diagnosis not present

## 2018-03-23 DIAGNOSIS — Z888 Allergy status to other drugs, medicaments and biological substances status: Secondary | ICD-10-CM

## 2018-03-23 DIAGNOSIS — S32415A Nondisplaced fracture of anterior wall of left acetabulum, initial encounter for closed fracture: Secondary | ICD-10-CM | POA: Diagnosis not present

## 2018-03-23 DIAGNOSIS — W19XXXA Unspecified fall, initial encounter: Secondary | ICD-10-CM

## 2018-03-23 DIAGNOSIS — R079 Chest pain, unspecified: Secondary | ICD-10-CM | POA: Diagnosis not present

## 2018-03-23 DIAGNOSIS — S32592A Other specified fracture of left pubis, initial encounter for closed fracture: Secondary | ICD-10-CM | POA: Diagnosis not present

## 2018-03-23 DIAGNOSIS — Z881 Allergy status to other antibiotic agents status: Secondary | ICD-10-CM

## 2018-03-23 DIAGNOSIS — Y92009 Unspecified place in unspecified non-institutional (private) residence as the place of occurrence of the external cause: Secondary | ICD-10-CM

## 2018-03-23 DIAGNOSIS — Z79899 Other long term (current) drug therapy: Secondary | ICD-10-CM

## 2018-03-23 DIAGNOSIS — Z7982 Long term (current) use of aspirin: Secondary | ICD-10-CM

## 2018-03-23 DIAGNOSIS — Z79890 Hormone replacement therapy (postmenopausal): Secondary | ICD-10-CM

## 2018-03-23 DIAGNOSIS — I350 Nonrheumatic aortic (valve) stenosis: Secondary | ICD-10-CM | POA: Diagnosis present

## 2018-03-23 DIAGNOSIS — S79912A Unspecified injury of left hip, initial encounter: Secondary | ICD-10-CM | POA: Diagnosis not present

## 2018-03-23 DIAGNOSIS — Z885 Allergy status to narcotic agent status: Secondary | ICD-10-CM

## 2018-03-23 DIAGNOSIS — S299XXA Unspecified injury of thorax, initial encounter: Secondary | ICD-10-CM | POA: Diagnosis not present

## 2018-03-23 DIAGNOSIS — M1990 Unspecified osteoarthritis, unspecified site: Secondary | ICD-10-CM | POA: Diagnosis present

## 2018-03-23 DIAGNOSIS — G629 Polyneuropathy, unspecified: Secondary | ICD-10-CM | POA: Diagnosis present

## 2018-03-23 LAB — COMPREHENSIVE METABOLIC PANEL
ALBUMIN: 4.1 g/dL (ref 3.5–5.0)
ALK PHOS: 111 U/L (ref 38–126)
ALT: 28 U/L (ref 14–54)
AST: 35 U/L (ref 15–41)
Anion gap: 9 (ref 5–15)
BUN: 19 mg/dL (ref 6–20)
CHLORIDE: 105 mmol/L (ref 101–111)
CO2: 24 mmol/L (ref 22–32)
CREATININE: 0.83 mg/dL (ref 0.44–1.00)
Calcium: 9.3 mg/dL (ref 8.9–10.3)
GFR calc non Af Amer: 58 mL/min — ABNORMAL LOW (ref >60)
GLUCOSE: 115 mg/dL — AB (ref 65–99)
Potassium: 3.8 mmol/L (ref 3.5–5.1)
SODIUM: 138 mmol/L (ref 135–145)
Total Bilirubin: 1.2 mg/dL (ref 0.3–1.2)
Total Protein: 7 g/dL (ref 6.5–8.1)

## 2018-03-23 LAB — CBC WITH DIFFERENTIAL/PLATELET
BASOS ABS: 0 10*3/uL (ref 0.0–0.1)
BASOS PCT: 0 %
EOS ABS: 0 10*3/uL (ref 0.0–0.7)
EOS PCT: 0 %
HCT: 45.1 % (ref 36.0–46.0)
HEMOGLOBIN: 15.6 g/dL — AB (ref 12.0–15.0)
Lymphocytes Relative: 8 %
Lymphs Abs: 1.2 10*3/uL (ref 0.7–4.0)
MCH: 33.7 pg (ref 26.0–34.0)
MCHC: 34.6 g/dL (ref 30.0–36.0)
MCV: 97.4 fL (ref 78.0–100.0)
Monocytes Absolute: 0.8 10*3/uL (ref 0.1–1.0)
Monocytes Relative: 5 %
NEUTROS PCT: 87 %
Neutro Abs: 13.7 10*3/uL — ABNORMAL HIGH (ref 1.7–7.7)
PLATELETS: 174 10*3/uL (ref 150–400)
RBC: 4.63 MIL/uL (ref 3.87–5.11)
RDW: 12.2 % (ref 11.5–15.5)
WBC: 15.8 10*3/uL — ABNORMAL HIGH (ref 4.0–10.5)

## 2018-03-23 LAB — MAGNESIUM: Magnesium: 1.9 mg/dL (ref 1.7–2.4)

## 2018-03-23 LAB — URINALYSIS, ROUTINE W REFLEX MICROSCOPIC
BILIRUBIN URINE: NEGATIVE
Bacteria, UA: NONE SEEN
GLUCOSE, UA: NEGATIVE mg/dL
Hgb urine dipstick: NEGATIVE
KETONES UR: NEGATIVE mg/dL
Nitrite: NEGATIVE
PH: 5 (ref 5.0–8.0)
PROTEIN: NEGATIVE mg/dL
Specific Gravity, Urine: 1.01 (ref 1.005–1.030)

## 2018-03-23 LAB — PHOSPHORUS: PHOSPHORUS: 3.1 mg/dL (ref 2.5–4.6)

## 2018-03-23 MED ORDER — HYDROMORPHONE HCL 1 MG/ML IJ SOLN
0.5000 mg | Freq: Once | INTRAMUSCULAR | Status: AC
  Administered 2018-03-23: 0.5 mg via INTRAVENOUS
  Filled 2018-03-23: qty 1

## 2018-03-23 MED ORDER — SODIUM CHLORIDE 0.9 % IV BOLUS
500.0000 mL | Freq: Once | INTRAVENOUS | Status: AC
Start: 2018-03-23 — End: 2018-03-23
  Administered 2018-03-23: 500 mL via INTRAVENOUS

## 2018-03-23 MED ORDER — ACETAMINOPHEN 650 MG RE SUPP: 650.0000 mg | Freq: Four times a day (QID) | RECTAL | Status: DC | PRN

## 2018-03-23 MED ORDER — ONDANSETRON HCL 4 MG/2ML IJ SOLN
4.0000 mg | Freq: Four times a day (QID) | INTRAMUSCULAR | Status: DC | PRN
  Administered 2018-03-26: 4 mg via INTRAVENOUS

## 2018-03-23 MED ORDER — HYDROMORPHONE HCL 1 MG/ML IJ SOLN
0.5000 mg | INTRAMUSCULAR | Status: DC | PRN
  Administered 2018-03-24 – 2018-03-26 (×2): 0.5 mg via INTRAVENOUS
  Filled 2018-03-23 (×2): qty 0.5

## 2018-03-23 MED ORDER — ONDANSETRON HCL 4 MG/2ML IJ SOLN
4.0000 mg | Freq: Once | INTRAMUSCULAR | Status: AC
  Administered 2018-03-23: 4 mg via INTRAVENOUS
  Filled 2018-03-23: qty 2

## 2018-03-23 MED ORDER — ACETAMINOPHEN 325 MG PO TABS: 650.0000 mg | ORAL_TABLET | Freq: Four times a day (QID) | ORAL | Status: DC | PRN

## 2018-03-23 MED ORDER — ONDANSETRON HCL 4 MG/2ML IJ SOLN
4.0000 mg | Freq: Four times a day (QID) | INTRAMUSCULAR | Status: DC | PRN
  Filled 2018-03-23: qty 2

## 2018-03-23 MED ORDER — ONDANSETRON HCL 4 MG PO TABS
4.0000 mg | ORAL_TABLET | Freq: Four times a day (QID) | ORAL | Status: DC | PRN
  Filled 2018-03-23: qty 1

## 2018-03-23 NOTE — ED Triage Notes (Signed)
Pt tripped today and fell and hurt left side of body. Is complaining of left sided groin pain. Hurts when trying to stand. Also left foot pain. Pt has a history of balance issues and inner ear problems. Pt alert and oriented x4. Did not hit head and not on any blood thinners

## 2018-03-23 NOTE — H&P (Signed)
History and Physical    Christine Juarez:[id number] DOB: 1920-01-26 DOA: 03/23/2018  PCP: Celene Squibb, MD   Patient coming from: Home.  I have personally briefly reviewed patient's old medical records in [place]  Chief Complaint: Fall.  HPI: Christine Juarez is a 84 y.o. female with medical history significant of aortic stenosis, osteoarthritis, CAD/CABG, hypertension, hyperlipidemia, peripheral neuropathy, hypothyroidism who is coming to the emergency department after having an accidental fall at home sustaining injuries to her left hip and left elbow area.  She has substantial tenderness on the left hip and has being unable to ambulate or bear weight since this happened.  She denies fever, chills, sore throat, wheezing, hemoptysis, dyspnea, but complains of occasional cough with mild production of clear yellowish sputum.  No chest pain, palpitations, dizziness, diaphoresis, PND, orthopnea or recent lower extremity edema.  No abdominal pain, nausea, emesis, diarrhea, melena or hematochezia.  He has occasional constipation.  She complains of urinary urgency, but no dysuria, hematuria or oliguria.  Denies heat or cold intolerance.  No polyuria, polydipsia, polyphagia or blurred vision.  Denies having anxiety or feeling sad.  ED Course: Initial vital signs temperature 97.9 F, pulse 84, respirations 154/86 mmHg, and O2 sat 95% on room air.  She was given Zofran 4 mg plus hydromorphone 0.5 mg in the ED with good pain control.  Lab work urinalysis shows trace leukocyte esterase, but is otherwise normal.  White count was 15.8 with 87% neutrophils, 8% lymphocytes and 5% monocytes.  Hemoglobin was 15.6 g/dL and platelets 174.  Her CMP showed a glucose of 150 mg/dL, all other values were normal.  Imaging: her chest radiograph showed previous CABG, stable cardiomegaly, but did not have any acute findings.  Initial hip and pelvic x-ray did not show any findings.  CT of the left hip without  contrast showed an acute, nondisplaced fracture of the left puboacetabular junction.  Please see images and full radiology reports for further detail.  Review of Systems: As per HPI otherwise 10 point review of systems negative.    Past Medical History:  Diagnosis Date  . Aortic stenosis    mild by echo 08/2015  . Arthritis   . CAD (coronary artery disease), native coronary artery    s/p remote CABG  . HTN (hypertension)   . Hyperlipidemia   . Neuropathy   . Thyroid disease     Past Surgical History:  Procedure Laterality Date  . APPENDECTOMY    . ARTERIAL BYPASS SURGRY    . BACK SURGERY    . COLONOSCOPY    . LIPOMA EXCISION    . SKULL FRACTURE ELEVATION    . UPPER GASTROINTESTINAL ENDOSCOPY       reports that she has never smoked. She has never used smokeless tobacco. She reports that she does not drink alcohol. Her drug history is not on file.  Allergies  Allergen Reactions  . Codeine     UNKNOWN  . Dicyclomine Hcl     UNKNOWN  . Erythromycin     UNKNOWN  . Lodine [Etodolac]   . Meclizine Hcl     amnesia  . Niacin     UNKNOWN  . Statins     Muscle pain    Family History  Problem Relation Age of Onset  . Colon cancer Sister   . Ovarian cancer Sister   . Alzheimer's disease Sister   . Colon cancer Sister   . Hypertension Daughter   .  GER disease Daughter   . Anxiety disorder Daughter   . Hyperlipidemia Daughter   . Hypertension Son   . Arthritis Unknown   . Cancer Unknown     Prior to Admission medications   Medication Sig Start Date End Date Taking? Authorizing Provider  aspirin 81 MG tablet Take 81 mg by mouth daily.     Yes [provider]  Coenzyme Q10 (COQ10) 100 MG CAPS Take 1 capsule by mouth daily.    Yes [provider]  ezetimibe (ZETIA) 10 MG tablet TAKE 1 TABLET BY MOUTH ONCE DAILY FOR CHOLESTEROL. 12/22/17  Yes Turner, Traci R, MD  isosorbide mononitrate (IMDUR) 60 MG 24 hr tablet TAKE 1 TABLET BY MOUTH ONCE DAILY.  10/10/17  Yes Turner, Eber Hong, MD  Multiple Vitamins-Minerals (OCUVITE PO) Take 1 tablet by mouth daily.   Yes [provider]  NORVASC 5 MG tablet TAKE (1) TABLET BY MOUTH ONCE DAILY. 12/13/17  Yes Turner, Eber Hong, MD  omeprazole (PRILOSEC) 20 MG capsule Take 20 mg by mouth daily. 09/04/13  Yes [provider]  SYNTHROID 88 MCG tablet Take 88 mcg by mouth daily before breakfast. 07/27/17  Yes [provider]    Physical Exam: Vitals:   03/23/18 1813 03/23/18 1815  BP: (!) 154/86   Pulse: 84   Temp: 97.9 F (36.6 C)   SpO2: 95%   Weight:  59.4 kg (131 lb)    Constitutional: NAD, calm, comfortable Eyes: PERRL, lids and conjunctivae normal ENMT: Mucous membranes are moist. Posterior pharynx clear of any exudate or lesions.Normal dentition.  Neck: normal, supple, no masses, no thyromegaly Respiratory: clear to auscultation bilaterally, no wheezing, no crackles. Normal respiratory effort. No accessory muscle use.  Cardiovascular: Regular rate and rhythm, 3/6 SEM, no rubs / gallops. No extremity edema. 2+ pedal pulses are palpable.. No carotid bruits.  Abdomen: Soft, no tenderness, no masses palpated. No hepatosplenomegaly. Bowel sounds positive.  Musculoskeletal: no clubbing / cyanosis.  Positive tenderness on left elbow area.  No decreased ROM of left elbow on passive motion.  Left hip tenderness on palpation.  Severely decreased ROM of left hip.  No contractures. Normal muscle tone.  Skin: Right pretibial area ecchymosis of about 4 x 6 cm.  Positive mild ecchymosis and erythema on left elbow area. Neurologic: CN 2-12 grossly intact. Sensation intact, DTR normal. Strength 5/5 in all 4.  Psychiatric: Normal judgment and insight. Alert and oriented x 3. Normal mood.    Labs on Admission: I have personally reviewed following labs and imaging studies  CBC: No results for input(s): WBC, NEUTROABS, HGB, HCT, MCV, PLT in the last 168 hours. Basic Metabolic  Panel: No results for input(s): NA, K, CL, CO2, GLUCOSE, BUN, CREATININE, CALCIUM, MG, PHOS in the last 168 hours. GFR: CrCl cannot be calculated (Patient's most recent lab result is older than the maximum 21 days allowed.). Liver Function Tests: No results for input(s): AST, ALT, ALKPHOS, BILITOT, PROT, ALBUMIN in the last 168 hours. No results for input(s): LIPASE, AMYLASE in the last 168 hours. No results for input(s): AMMONIA in the last 168 hours. Coagulation Profile: No results for input(s): INR, PROTIME in the last 168 hours. Cardiac Enzymes: No results for input(s): CKTOTAL, CKMB, CKMBINDEX, TROPONINI in the last 168 hours. BNP (last 3 results) No results for input(s): PROBNP in the last 8760 hours. HbA1C: No results for input(s): HGBA1C in the last 72 hours. CBG: No results for input(s): GLUCAP in the last 168 hours. Lipid  Profile: No results for input(s): CHOL, HDL, LDLCALC, TRIG, CHOLHDL, LDLDIRECT in the last 72 hours. Thyroid Function Tests: No results for input(s): TSH, T4TOTAL, FREET4, T3FREE, THYROIDAB in the last 72 hours. Anemia Panel: No results for input(s): VITAMINB12, FOLATE, FERRITIN, TIBC, IRON, RETICCTPCT in the last 72 hours. Urine analysis:    Component Value Date/Time   COLORURINE BROWN (A) 07/14/2016 0225   APPEARANCEUR CLEAR 07/14/2016 0225   LABSPEC 1.025 07/14/2016 0225   PHURINE 6.5 07/14/2016 0225   GLUCOSEU NEGATIVE 07/14/2016 0225   HGBUR NEGATIVE 07/14/2016 0225   HGBUR negative 03/27/2007 1304   BILIRUBINUR LARGE (A) 07/14/2016 0225   KETONESUR 15 (A) 07/14/2016 0225   PROTEINUR 30 (A) 07/14/2016 0225   UROBILINOGEN 1.0 03/27/2007 1304   NITRITE POSITIVE (A) 07/14/2016 0225   LEUKOCYTESUR TRACE (A) 07/14/2016 0225    Radiological Exams on Admission: Dg Chest 2 View  Result Date: 03/23/2018 CLINICAL DATA:  Fall today. EXAM: CHEST - 2 VIEW COMPARISON:  Chest radiograph 10/29/2010 FINDINGS: Post median sternotomy and CABG. Unchanged  cardiomegaly. Atherosclerosis of the thoracic aorta. Lower lung volumes from prior exam leading to bronchovascular crowding. No pneumothorax or focal airspace disease. No pulmonary edema or pleural fluid. No visualized rib fracture or acute osseous abnormality. Bones are under mineralized. IMPRESSION: 1. No acute findings. 2. Post CABG with stable cardiomegaly. Electronically Signed   By: Jeb Levering M.D.   On: 03/23/2018 19:20   Ct Hip Left Wo Contrast  Result Date: 03/23/2018 CLINICAL DATA:  Left forearm pain after fall. EXAM: CT OF THE LEFT HIP WITHOUT CONTRAST TECHNIQUE: Multidetector CT imaging of the left hip was performed according to the standard protocol. Multiplanar CT image reconstructions were also generated. COMPARISON:  Left hip x-rays from same day. FINDINGS: Bones/Joint/Cartilage Acute, nondisplaced fracture of the left puboacetabular junction. Slightly angulated deformity of the left inferior pubic ramus without discrete fracture line may reflect an old healed fracture. No femur fracture. No dislocation. Diffuse osteopenia. No hip joint effusion. The hip joint space is well preserved. Ligaments Suboptimally assessed by CT. Muscles and Tendons Mild atrophy of the gluteus minimus muscle. The visualized left gluteal, iliopsoas, and hamstring tendons are unremarkable. Soft tissues Minimal subcutaneous fat stranding over the lateral hip. No soft tissue mass or fluid collection. Extensive sigmoid diverticulosis. Small fat containing left inguinal hernia. IMPRESSION: 1. Acute, nondisplaced fracture of the left puboacetabular junction. Electronically Signed   By: Titus Dubin M.D.   On: 03/23/2018 22:06   Dg Hip Unilat W Or Wo Pelvis 2-3 Views Left  Result Date: 03/23/2018 CLINICAL DATA:  Fall today with left hip pain. EXAM: DG HIP (WITH OR WITHOUT PELVIS) 2-3V LEFT COMPARISON:  None. FINDINGS: The cortical margins of the bony pelvis and left hip are intact. No fracture. Pubic symphysis and  sacroiliac joints are congruent. Both femoral heads are well-seated in the respective acetabula. Bones are under mineralized. IMPRESSION: No pelvic or left hip fracture. Electronically Signed   By: Jeb Levering M.D.   On: 03/23/2018 19:18    EKG: Independently reviewed.  Vent. rate 82 BPM PR interval * ms QRS duration 116 ms QT/QTc 424/496 ms P-R-T axes 44 99 104 Sinus rhythm Nonspecific intraventricular conduction delay Low voltage, precordial leads Probable anteroseptal infarct, old  Assessment/Plan Principal Problem:   Closed fracture of left pubis (HCC) Observation/MedSurg. Continue hydromorphone 0.5 mg IVP every 4 hours as needed. Continue Zofran as needed for nausea. I have added acetaminophen as well for pain control. Switch to oral  medication in the morning. Consult PT for evaluation and treatment. Consult case management for disposition arrangements.  Active Problems:   Hypothyroidism Continue Synthroid 88 mcg p.o. daily. Check TSH as needed.    Dyslipidemia Continue Zetia 10 mg p.o. daily.    Essential hypertension Continue Norvasc 5 mg p.o. daily. Monitor blood pressure.    Coronary atherosclerosis Denies chest pain or dyspnea at this time. Continue aspirin 81 mg p.o. daily. Continue Zetia 10 mg p.o. daily. Continue Imdur 60 mg p.o. daily.    DVT prophylaxis: Lovenox SQ. Code Status: Full code Family Communication: Jamas Lav and Jenny Reichmann, her daughters were present in the ED room. Disposition Plan: Observation for pain control and PT evaluation. Consults called: Orthopedic surgery was contacted by Dr. Roderic Palau and Dr. Tomi Bamberger. Admission status: Observation/telemetry.   Reubin Milan MD [facility] Pager [phone].    If 7PM-7AM, please contact night-coverage [email or web address] Password [facility]  03/23/2018, 10:44 PM

## 2018-03-23 NOTE — ED Provider Notes (Signed)
[facility] EMERGENCY DEPARTMENT Provider Note   CSN: [id number] Arrival date & time: 03/23/18  1810     History   Chief Complaint Chief Complaint  Patient presents with  . Fall    HPI Christine Juarez is a 84 y.o. female.  Patient fell today and hurt her left hip area.  Patient did not hit her head  The history is provided by the patient. No language interpreter was used.  Fall  This is a new problem. The current episode started less than 1 hour ago. The problem occurs rarely. The problem has been resolved. Pertinent negatives include no chest pain, no abdominal pain and no headaches. Nothing aggravates the symptoms. Nothing relieves the symptoms.    Past Medical History:  Diagnosis Date  . Aortic stenosis    mild by echo 08/2015  . Arthritis   . CAD (coronary artery disease), native coronary artery    s/p remote CABG  . HTN (hypertension)   . Hyperlipidemia   . Neuropathy   . Thyroid disease     Patient Active Problem List   Diagnosis Date Noted  . Aortic stenosis 08/10/2016  . Diarrhea 05/09/2012  . Rotator cuff syndrome of right shoulder 08/03/2011  . Rotator cuff tear, right 08/03/2011  . TIA 03/12/2008  . HIP PAIN, BILATERAL 03/12/2008  . ASTHMATIC BRONCHITIS, ACUTE 10/25/2007  . PERIPHERAL NEUROPATHY 05/03/2007  . DYSEQUILIBRIUM 05/03/2007  . LEG PAIN 04/14/2007  . ANKLE PAIN, LEFT 03/27/2007  . EDEMA LEG 03/27/2007  . BENIGN POSITIONAL VERTIGO 12/02/2006  . HEADACHE 12/02/2006  . COLONIC POLYPS 10/21/2006  . HYPOTHYROIDISM 10/21/2006  . Dyslipidemia 10/21/2006  . COMMON MIGRAINE 10/21/2006  . Essential hypertension 10/21/2006  . Coronary atherosclerosis 10/21/2006  . ASTHMA 10/21/2006  . IRRITABLE BOWEL SYNDROME 10/21/2006  . FIBROCYSTIC BREAST DISEASE 10/21/2006  . DEGENERATIVE DISC DISEASE 10/21/2006  . LOW BACK PAIN 10/21/2006    Past Surgical History:  Procedure Laterality Date  . APPENDECTOMY    . ARTERIAL BYPASS SURGRY    . BACK  SURGERY    . COLONOSCOPY    . LIPOMA EXCISION    . SKULL FRACTURE ELEVATION    . UPPER GASTROINTESTINAL ENDOSCOPY       OB History   None      Home Medications    Prior to Admission medications   Medication Sig Start Date End Date Taking? Authorizing Provider  aspirin 81 MG tablet Take 81 mg by mouth daily.     Yes [provider]  Coenzyme Q10 (COQ10) 100 MG CAPS Take 1 capsule by mouth daily.    Yes [provider]  ezetimibe (ZETIA) 10 MG tablet TAKE 1 TABLET BY MOUTH ONCE DAILY FOR CHOLESTEROL. 12/22/17  Yes Turner, Traci R, MD  isosorbide mononitrate (IMDUR) 60 MG 24 hr tablet TAKE 1 TABLET BY MOUTH ONCE DAILY. 10/10/17  Yes Turner, Eber Hong, MD  Multiple Vitamins-Minerals (OCUVITE PO) Take 1 tablet by mouth daily.   Yes [provider]  NORVASC 5 MG tablet TAKE (1) TABLET BY MOUTH ONCE DAILY. 12/13/17  Yes Turner, Eber Hong, MD  omeprazole (PRILOSEC) 20 MG capsule Take 20 mg by mouth daily. 09/04/13  Yes [provider]  SYNTHROID 88 MCG tablet Take 88 mcg by mouth daily before breakfast. 07/27/17  Yes [provider]    Family History Family History  Problem Relation Age of Onset  . Colon cancer Sister   . Ovarian cancer Sister   . Alzheimer's disease Sister   .  Colon cancer Sister   . Hypertension Daughter   . GER disease Daughter   . Anxiety disorder Daughter   . Hyperlipidemia Daughter   . Hypertension Son   . Arthritis Unknown   . Cancer Unknown     Social History Social History   Tobacco Use  . Smoking status: Never Smoker  . Smokeless tobacco: Never Used  Substance Use Topics  . Alcohol use: No  . Drug use: Not on file     Allergies   Codeine; Dicyclomine hcl; Erythromycin; Lodine [etodolac]; Meclizine hcl; Niacin; and Statins   Review of Systems Review of Systems  Constitutional: Negative for appetite change and fatigue.  HENT: Negative for congestion, ear discharge and sinus pressure.   Eyes: Negative  for discharge.  Respiratory: Negative for cough.   Cardiovascular: Negative for chest pain.  Gastrointestinal: Negative for abdominal pain and diarrhea.  Genitourinary: Negative for frequency and hematuria.  Musculoskeletal: Negative for back pain.       Pain in left hip.  Skin: Negative for rash.  Neurological: Negative for seizures and headaches.  Psychiatric/Behavioral: Negative for hallucinations.     Physical Exam Updated Vital Signs BP (!) 154/86 (BP Location: Right Arm)   Pulse 84   Temp 97.9 F (36.6 C)   Wt 59.4 kg (131 lb)   SpO2 95%   BMI 26.46 kg/m   Physical Exam  Constitutional: She is oriented to person, place, and time. She appears well-developed.  HENT:  Head: Normocephalic.  Eyes: Conjunctivae and EOM are normal. No scleral icterus.  Neck: Neck supple. No thyromegaly present.  Cardiovascular: Normal rate and regular rhythm. Exam reveals no gallop and no friction rub.  No murmur heard. Pulmonary/Chest: No stridor. She has no wheezes. She has no rales. She exhibits no tenderness.  Abdominal: She exhibits no distension. There is no tenderness. There is no rebound.  Musculoskeletal: She exhibits no edema.  She has tenderness to left hip and she cannot ambulate  Lymphadenopathy:    She has no cervical adenopathy.  Neurological: She is oriented to person, place, and time. She exhibits normal muscle tone. Coordination normal.  Skin: No rash noted. No erythema.  Psychiatric: She has a normal mood and affect. Her behavior is normal.     ED Treatments / Results  Labs (all labs ordered are listed, but only abnormal results are displayed) Labs Reviewed  CBC WITH DIFFERENTIAL/PLATELET  COMPREHENSIVE METABOLIC PANEL    EKG None  Radiology Dg Chest 2 View  Result Date: 03/23/2018 CLINICAL DATA:  Fall today. EXAM: CHEST - 2 VIEW COMPARISON:  Chest radiograph 10/29/2010 FINDINGS: Post median sternotomy and CABG. Unchanged cardiomegaly. Atherosclerosis of the  thoracic aorta. Lower lung volumes from prior exam leading to bronchovascular crowding. No pneumothorax or focal airspace disease. No pulmonary edema or pleural fluid. No visualized rib fracture or acute osseous abnormality. Bones are under mineralized. IMPRESSION: 1. No acute findings. 2. Post CABG with stable cardiomegaly. Electronically Signed   By: Jeb Levering M.D.   On: 03/23/2018 19:20   Ct Hip Left Wo Contrast  Result Date: 03/23/2018 CLINICAL DATA:  Left forearm pain after fall. EXAM: CT OF THE LEFT HIP WITHOUT CONTRAST TECHNIQUE: Multidetector CT imaging of the left hip was performed according to the standard protocol. Multiplanar CT image reconstructions were also generated. COMPARISON:  Left hip x-rays from same day. FINDINGS: Bones/Joint/Cartilage Acute, nondisplaced fracture of the left puboacetabular junction. Slightly angulated deformity of the left inferior pubic ramus without discrete fracture line may  reflect an old healed fracture. No femur fracture. No dislocation. Diffuse osteopenia. No hip joint effusion. The hip joint space is well preserved. Ligaments Suboptimally assessed by CT. Muscles and Tendons Mild atrophy of the gluteus minimus muscle. The visualized left gluteal, iliopsoas, and hamstring tendons are unremarkable. Soft tissues Minimal subcutaneous fat stranding over the lateral hip. No soft tissue mass or fluid collection. Extensive sigmoid diverticulosis. Small fat containing left inguinal hernia. IMPRESSION: 1. Acute, nondisplaced fracture of the left puboacetabular junction. Electronically Signed   By: Titus Dubin M.D.   On: 03/23/2018 22:06   Dg Hip Unilat W Or Wo Pelvis 2-3 Views Left  Result Date: 03/23/2018 CLINICAL DATA:  Fall today with left hip pain. EXAM: DG HIP (WITH OR WITHOUT PELVIS) 2-3V LEFT COMPARISON:  None. FINDINGS: The cortical margins of the bony pelvis and left hip are intact. No fracture. Pubic symphysis and sacroiliac joints are congruent. Both  femoral heads are well-seated in the respective acetabula. Bones are under mineralized. IMPRESSION: No pelvic or left hip fracture. Electronically Signed   By: Jeb Levering M.D.   On: 03/23/2018 19:18    Procedures Procedures (including critical care time)  Medications Ordered in ED Medications  sodium chloride 0.9 % bolus 500 mL (has no administration in time range)  HYDROmorphone (DILAUDID) injection 0.5 mg (has no administration in time range)  ondansetron (ZOFRAN) injection 4 mg (has no administration in time range)     Initial Impression / Assessment and Plan / ED Course  I have reviewed the triage vital signs and the nursing notes.  Pertinent labs & imaging results that were available during my care of the patient were reviewed by me and considered in my medical decision making (see chart for details).     Patient has a fracture of the left pubo-acetabular junction.  I spoke to orthopedics Dr. Doreatha Martin and he feels like the patient needs to be admitted to medicine and he will consult  Final Clinical Impressions(s) / ED Diagnoses   Final diagnoses:  Fall, initial encounter    ED Discharge Orders    None       Milton Ferguson, MD 03/23/18 2223

## 2018-03-24 ENCOUNTER — Other Ambulatory Visit: Payer: Self-pay

## 2018-03-24 ENCOUNTER — Observation Stay (HOSPITAL_COMMUNITY): Payer: Medicare Other

## 2018-03-24 ENCOUNTER — Encounter (HOSPITAL_COMMUNITY): Payer: Self-pay

## 2018-03-24 DIAGNOSIS — J479 Bronchiectasis, uncomplicated: Secondary | ICD-10-CM | POA: Diagnosis present

## 2018-03-24 DIAGNOSIS — S32592D Other specified fracture of left pubis, subsequent encounter for fracture with routine healing: Secondary | ICD-10-CM | POA: Diagnosis not present

## 2018-03-24 DIAGNOSIS — J84115 Respiratory bronchiolitis interstitial lung disease: Secondary | ICD-10-CM | POA: Diagnosis not present

## 2018-03-24 DIAGNOSIS — M1990 Unspecified osteoarthritis, unspecified site: Secondary | ICD-10-CM | POA: Diagnosis present

## 2018-03-24 DIAGNOSIS — Z7982 Long term (current) use of aspirin: Secondary | ICD-10-CM | POA: Diagnosis not present

## 2018-03-24 DIAGNOSIS — Z885 Allergy status to narcotic agent status: Secondary | ICD-10-CM | POA: Diagnosis not present

## 2018-03-24 DIAGNOSIS — I2510 Atherosclerotic heart disease of native coronary artery without angina pectoris: Secondary | ICD-10-CM | POA: Diagnosis not present

## 2018-03-24 DIAGNOSIS — Y92009 Unspecified place in unspecified non-institutional (private) residence as the place of occurrence of the external cause: Secondary | ICD-10-CM | POA: Diagnosis not present

## 2018-03-24 DIAGNOSIS — D696 Thrombocytopenia, unspecified: Secondary | ICD-10-CM | POA: Diagnosis present

## 2018-03-24 DIAGNOSIS — I10 Essential (primary) hypertension: Secondary | ICD-10-CM | POA: Diagnosis not present

## 2018-03-24 DIAGNOSIS — M25552 Pain in left hip: Secondary | ICD-10-CM | POA: Diagnosis present

## 2018-03-24 DIAGNOSIS — Z8249 Family history of ischemic heart disease and other diseases of the circulatory system: Secondary | ICD-10-CM | POA: Diagnosis not present

## 2018-03-24 DIAGNOSIS — E039 Hypothyroidism, unspecified: Secondary | ICD-10-CM | POA: Diagnosis present

## 2018-03-24 DIAGNOSIS — J849 Interstitial pulmonary disease, unspecified: Secondary | ICD-10-CM | POA: Diagnosis present

## 2018-03-24 DIAGNOSIS — Z79899 Other long term (current) drug therapy: Secondary | ICD-10-CM | POA: Diagnosis not present

## 2018-03-24 DIAGNOSIS — S32502D Unspecified fracture of left pubis, subsequent encounter for fracture with routine healing: Secondary | ICD-10-CM

## 2018-03-24 DIAGNOSIS — E038 Other specified hypothyroidism: Secondary | ICD-10-CM

## 2018-03-24 DIAGNOSIS — I350 Nonrheumatic aortic (valve) stenosis: Secondary | ICD-10-CM | POA: Diagnosis present

## 2018-03-24 DIAGNOSIS — Z951 Presence of aortocoronary bypass graft: Secondary | ICD-10-CM | POA: Diagnosis not present

## 2018-03-24 DIAGNOSIS — Z888 Allergy status to other drugs, medicaments and biological substances status: Secondary | ICD-10-CM | POA: Diagnosis not present

## 2018-03-24 DIAGNOSIS — E785 Hyperlipidemia, unspecified: Secondary | ICD-10-CM | POA: Diagnosis not present

## 2018-03-24 DIAGNOSIS — G629 Polyneuropathy, unspecified: Secondary | ICD-10-CM | POA: Diagnosis present

## 2018-03-24 DIAGNOSIS — Z881 Allergy status to other antibiotic agents status: Secondary | ICD-10-CM | POA: Diagnosis not present

## 2018-03-24 DIAGNOSIS — M6281 Muscle weakness (generalized): Secondary | ICD-10-CM | POA: Diagnosis not present

## 2018-03-24 DIAGNOSIS — J9601 Acute respiratory failure with hypoxia: Secondary | ICD-10-CM

## 2018-03-24 DIAGNOSIS — Z9181 History of falling: Secondary | ICD-10-CM | POA: Diagnosis not present

## 2018-03-24 DIAGNOSIS — R262 Difficulty in walking, not elsewhere classified: Secondary | ICD-10-CM | POA: Diagnosis not present

## 2018-03-24 DIAGNOSIS — R911 Solitary pulmonary nodule: Secondary | ICD-10-CM | POA: Diagnosis not present

## 2018-03-24 DIAGNOSIS — Z79890 Hormone replacement therapy (postmenopausal): Secondary | ICD-10-CM | POA: Diagnosis not present

## 2018-03-24 DIAGNOSIS — Z4789 Encounter for other orthopedic aftercare: Secondary | ICD-10-CM | POA: Diagnosis not present

## 2018-03-24 DIAGNOSIS — S32592A Other specified fracture of left pubis, initial encounter for closed fracture: Secondary | ICD-10-CM | POA: Diagnosis present

## 2018-03-24 DIAGNOSIS — W010XXA Fall on same level from slipping, tripping and stumbling without subsequent striking against object, initial encounter: Secondary | ICD-10-CM | POA: Diagnosis present

## 2018-03-24 LAB — CBC
HEMATOCRIT: 40.3 % (ref 36.0–46.0)
Hemoglobin: 13.3 g/dL (ref 12.0–15.0)
MCH: 32.4 pg (ref 26.0–34.0)
MCHC: 33 g/dL (ref 30.0–36.0)
MCV: 98.3 fL (ref 78.0–100.0)
Platelets: 161 10*3/uL (ref 150–400)
RBC: 4.1 MIL/uL (ref 3.87–5.11)
RDW: 12.1 % (ref 11.5–15.5)
WBC: 12.3 10*3/uL — AB (ref 4.0–10.5)

## 2018-03-24 LAB — COMPREHENSIVE METABOLIC PANEL
ALK PHOS: 89 U/L (ref 38–126)
ALT: 25 U/L (ref 14–54)
AST: 28 U/L (ref 15–41)
Albumin: 3.2 g/dL — ABNORMAL LOW (ref 3.5–5.0)
Anion gap: 7 (ref 5–15)
BILIRUBIN TOTAL: 0.9 mg/dL (ref 0.3–1.2)
BUN: 17 mg/dL (ref 6–20)
CALCIUM: 8.4 mg/dL — AB (ref 8.9–10.3)
CO2: 25 mmol/L (ref 22–32)
CREATININE: 0.76 mg/dL (ref 0.44–1.00)
Chloride: 108 mmol/L (ref 101–111)
Glucose, Bld: 132 mg/dL — ABNORMAL HIGH (ref 65–99)
Potassium: 4.4 mmol/L (ref 3.5–5.1)
Sodium: 140 mmol/L (ref 135–145)
TOTAL PROTEIN: 5.5 g/dL — AB (ref 6.5–8.1)

## 2018-03-24 LAB — PROTIME-INR
INR: 1.04
PROTHROMBIN TIME: 13.5 s (ref 11.4–15.2)

## 2018-03-24 LAB — PROCALCITONIN: Procalcitonin: <0.1 ng/mL

## 2018-03-24 MED ORDER — ENOXAPARIN SODIUM 40 MG/0.4ML ~~LOC~~ SOLN: 40.0000 mg | SUBCUTANEOUS | Status: DC

## 2018-03-24 MED ORDER — COQ10 100 MG PO CAPS: 1.0000 | ORAL_CAPSULE | Freq: Every day | ORAL | Status: DC

## 2018-03-24 MED ORDER — EZETIMIBE 10 MG PO TABS
10.0000 mg | ORAL_TABLET | Freq: Every day | ORAL | Status: DC
  Administered 2018-03-24 – 2018-03-27 (×4): 10 mg via ORAL
  Filled 2018-03-24 (×4): qty 1

## 2018-03-24 MED ORDER — ENOXAPARIN SODIUM 30 MG/0.3ML ~~LOC~~ SOLN
30.0000 mg | SUBCUTANEOUS | Status: DC
  Administered 2018-03-24 – 2018-03-27 (×4): 30 mg via SUBCUTANEOUS
  Filled 2018-03-24 (×4): qty 0.3

## 2018-03-24 MED ORDER — ISOSORBIDE MONONITRATE ER 60 MG PO TB24
60.0000 mg | ORAL_TABLET | Freq: Every day | ORAL | Status: DC
  Administered 2018-03-24 – 2018-03-27 (×4): 60 mg via ORAL
  Filled 2018-03-24 (×4): qty 1

## 2018-03-24 MED ORDER — PANTOPRAZOLE SODIUM 40 MG PO TBEC
40.0000 mg | DELAYED_RELEASE_TABLET | Freq: Every day | ORAL | Status: DC
  Administered 2018-03-24 – 2018-03-27 (×4): 40 mg via ORAL
  Filled 2018-03-24 (×4): qty 1

## 2018-03-24 MED ORDER — LEVOTHYROXINE SODIUM 88 MCG PO TABS
88.0000 ug | ORAL_TABLET | Freq: Every day | ORAL | Status: DC
  Administered 2018-03-24 – 2018-03-27 (×4): 88 ug via ORAL
  Filled 2018-03-24 (×4): qty 1

## 2018-03-24 MED ORDER — ASPIRIN EC 81 MG PO TBEC
81.0000 mg | DELAYED_RELEASE_TABLET | Freq: Every day | ORAL | Status: DC
  Administered 2018-03-24 – 2018-03-27 (×4): 81 mg via ORAL
  Filled 2018-03-24 (×4): qty 1

## 2018-03-24 MED ORDER — AMLODIPINE BESYLATE 5 MG PO TABS
5.0000 mg | ORAL_TABLET | Freq: Every day | ORAL | Status: DC
  Administered 2018-03-24 – 2018-03-27 (×4): 5 mg via ORAL
  Filled 2018-03-24 (×4): qty 1

## 2018-03-24 NOTE — Progress Notes (Signed)
PROGRESS NOTE  Christine Juarez ZOX:[id number] DOB: 04/28/1920 DOA: 03/23/2018 PCP: Celene Squibb, MD  Brief History:  84 year old female with a history of coronary artery disease, hypertension, hyperlipidemia, hypothyroidism, aortic stenosis presenting after mechanical fall getting up from her dining room table.  The patient had significant left hip pain and was unable to bear any weight.  She was brought to the hospital were initial x-ray of the left hip was negative for any pelvic fracture.  However follow-up CT of the left hip revealed an acute non-displaced fracture of the left puboacetabular junction.  Orthopedic surgery, Dr. Doreatha Martin, was consulted who did felt pt could be managed nonoperatively.  The patient has complained of some intermittent shortness of breath and nonproductive cough.  She denies any fevers, chills, chest pain, nausea, vomiting, diarrhea, abdominal pain.  She has had some urinary urgency and urinary retention type symptoms.  She was noted to have WBC 15.8 but was otherwise afebrile hemodynamically stable.  She was noted to be hypoxic with oxygen saturation 88-91%.  As result, the patient was placed on supplemental oxygen.  Assessment/Plan: Closed left pubic rami fracture -Secondary to mechanical fall -Appreciate orthopedics, Dr. Doreatha Martin nonoperative treatment for now -Pain control -PT evaluation -will likely need SNF  Acute respiratory failure with hypoxia -etiology unclear presently -CT chest -supplemental oxyygen -start duonebs -presently stable on 2 L  RLL opacity -Personally reviewed chest x-ray--bibasilar opacities, right greater than left -CT chest -Check procalcitonin  Coronary artery disease -No chest pain presently -Personally reviewed EKG--sinus rhythm, nonspecific T wave changes -Continue aspirin, Imdur  Essential hypertension -Continue amlodipine -Blood pressure elevation in part due to pain  Hypothyroidism -continue  Synthroid     Disposition Plan:   SNF 1-2 days  Family Communication:   No Family at bedside  Consultants:  Ortho--[place]  Code Status:  FULL   DVT Prophylaxis:  [place] Heparin / [place] Lovenox   Procedures: As Listed in Progress Note Above  Antibiotics: None    Subjective:   Objective: Vitals:   03/24/18 0130 03/24/18 0233 03/24/18 0604 03/24/18 0631  BP: (!) 150/83 130/64 (!) 142/71   Pulse: 84 76 66   Resp: 16 20 18    Temp:  98.3 F (36.8 C) 97.7 F (36.5 C)   TempSrc:  Oral Oral Oral  SpO2: 98% 95% 97%   Weight:  62.5 kg (137 lb 12.6 oz)      Intake/Output Summary (Last 24 hours) at 03/24/2018 0901 Last data filed at 03/24/2018 0200 Gross per 24 hour  Intake 0 ml  Output 0 ml  Net 0 ml   Weight change:  Exam:   General:  Pt is alert, follows commands appropriately, not in acute distress  HEENT: No icterus, No thrush, No neck mass, [place]/AT  Cardiovascular: RRR, S1/S2, no rubs, no gallops  Respiratory: CTA bilaterally, no wheezing, no crackles, no rhonchi  Abdomen: Soft/+BS, non tender, non distended, no guarding  Extremities: No edema, No lymphangitis, No petechiae, No rashes, no synovitis   Data Reviewed: I have personally reviewed following labs and imaging studies Basic Metabolic Panel: Recent Labs  Lab 03/23/18 2232 03/24/18 0354  NA 138 140  K 3.8 4.4  CL 105 108  CO2 24 25  GLUCOSE 115* 132*  BUN 19 17  CREATININE 0.83 0.76  CALCIUM 9.3 8.4*  MG 1.9  --   PHOS 3.1  --    Liver Function Tests: Recent Labs  Lab 03/23/18  2232 03/24/18 0354  AST 35 28  ALT 28 25  ALKPHOS 111 89  BILITOT 1.2 0.9  PROT 7.0 5.5*  ALBUMIN 4.1 3.2*   No results for input(s): LIPASE, AMYLASE in the last 168 hours. No results for input(s): AMMONIA in the last 168 hours. Coagulation Profile: Recent Labs  Lab 03/24/18 0354  INR 1.04   CBC: Recent Labs  Lab 03/23/18 2232 03/24/18 0354  WBC 15.8* 12.3*  NEUTROABS 13.7*  --   HGB 15.6* 13.3  HCT  45.1 40.3  MCV 97.4 98.3  PLT 174 161   Cardiac Enzymes: No results for input(s): CKTOTAL, CKMB, CKMBINDEX, TROPONINI in the last 168 hours. BNP: Invalid input(s): POCBNP CBG: No results for input(s): GLUCAP in the last 168 hours. HbA1C: No results for input(s): HGBA1C in the last 72 hours. Urine analysis:    Component Value Date/Time   COLORURINE YELLOW 03/23/2018 [place] 03/23/2018 2315   LABSPEC 1.010 03/23/2018 2315   PHURINE 5.0 03/23/2018 2315   GLUCOSEU NEGATIVE 03/23/2018 2315   HGBUR NEGATIVE 03/23/2018 2315   HGBUR negative 03/27/2007 1304   BILIRUBINUR NEGATIVE 03/23/2018 2315   KETONESUR NEGATIVE 03/23/2018 2315   PROTEINUR NEGATIVE 03/23/2018 2315   UROBILINOGEN 1.0 03/27/2007 1304   NITRITE NEGATIVE 03/23/2018 2315   LEUKOCYTESUR TRACE (A) 03/23/2018 2315   Sepsis Labs: @LABRCNTIP (procalcitonin:4,lacticidven:4) )No results found for this or any previous visit (from the past 240 hour(s)).   Scheduled Meds: . amLODipine  5 mg Oral Daily  . aspirin EC  81 mg Oral Daily  . ezetimibe  10 mg Oral Daily  . isosorbide mononitrate  60 mg Oral Daily  . levothyroxine  88 mcg Oral QAC breakfast  . pantoprazole  40 mg Oral Daily   Continuous Infusions:  Procedures/Studies: Dg Chest 2 View  Result Date: 03/23/2018 CLINICAL DATA:  Fall today. EXAM: CHEST - 2 VIEW COMPARISON:  Chest radiograph 10/29/2010 FINDINGS: Post median sternotomy and CABG. Unchanged cardiomegaly. Atherosclerosis of the thoracic aorta. Lower lung volumes from prior exam leading to bronchovascular crowding. No pneumothorax or focal airspace disease. No pulmonary edema or pleural fluid. No visualized rib fracture or acute osseous abnormality. Bones are under mineralized. IMPRESSION: 1. No acute findings. 2. Post CABG with stable cardiomegaly. Electronically Signed   By: Jeb Levering M.D.   On: 03/23/2018 19:20   Ct Hip Left Wo Contrast  Result Date: 03/23/2018 CLINICAL DATA:   Left forearm pain after fall. EXAM: CT OF THE LEFT HIP WITHOUT CONTRAST TECHNIQUE: Multidetector CT imaging of the left hip was performed according to the standard protocol. Multiplanar CT image reconstructions were also generated. COMPARISON:  Left hip x-rays from same day. FINDINGS: Bones/Joint/Cartilage Acute, nondisplaced fracture of the left puboacetabular junction. Slightly angulated deformity of the left inferior pubic ramus without discrete fracture line may reflect an old healed fracture. No femur fracture. No dislocation. Diffuse osteopenia. No hip joint effusion. The hip joint space is well preserved. Ligaments Suboptimally assessed by CT. Muscles and Tendons Mild atrophy of the gluteus minimus muscle. The visualized left gluteal, iliopsoas, and hamstring tendons are unremarkable. Soft tissues Minimal subcutaneous fat stranding over the lateral hip. No soft tissue mass or fluid collection. Extensive sigmoid diverticulosis. Small fat containing left inguinal hernia. IMPRESSION: 1. Acute, nondisplaced fracture of the left puboacetabular junction. Electronically Signed   By: Titus Dubin M.D.   On: 03/23/2018 22:06   Dg Hip Unilat W Or Wo Pelvis 2-3 Views Left  Result Date: 03/23/2018 CLINICAL  DATA:  Fall today with left hip pain. EXAM: DG HIP (WITH OR WITHOUT PELVIS) 2-3V LEFT COMPARISON:  None. FINDINGS: The cortical margins of the bony pelvis and left hip are intact. No fracture. Pubic symphysis and sacroiliac joints are congruent. Both femoral heads are well-seated in the respective acetabula. Bones are under mineralized. IMPRESSION: No pelvic or left hip fracture. Electronically Signed   By: Jeb Levering M.D.   On: 03/23/2018 19:18    Orson Eva, DO  [facility] Pager [phone]  If 7PM-7AM, please contact night-coverage [email or web address] Password [email or web address] 03/24/2018, 9:01 AM   LOS: 1 day

## 2018-03-24 NOTE — Clinical Social Work Note (Signed)
Clinical Social Work Assessment  Patient Details  Name: Christine Juarez MRN: [id number] Date of Birth: 1920/04/29  Date of referral:  03/24/18               Reason for consult:  Facility Placement                Permission sought to share information with:    Permission granted to share information::     Name::        Agency::     Relationship::     Contact Information:  Daughter, Beulah Gandy, was at bedside.   Housing/Transportation Living arrangements for the past 2 months:  [place] of Information:  Patient, Adult Children Patient Interpreter Needed:  None Criminal Activity/Legal Involvement Pertinent to Current Situation/Hospitalization:  No - Comment as needed Significant Relationships:  None Lives with:  Adult Children Do you feel safe going back to the place where you live?  Yes Need for family participation in patient care:  Yes (Comment)  Care giving concerns:  None identified at baseline.    Social Worker assessment / plan:  Patient's adult son lives in the home with her. At baseline, she is independent with ADLs and ambulates with a cane.   Employment status:  Retired Forensic scientist:  Medicare PT Recommendations:  [place] / Referral to community resources:  [place]  Patient/Family's Response to care:  Family and patient are agreeable to short term rehab at a skilled nursing facility.   Patient/Family's Understanding of and Emotional Response to Diagnosis, Current Treatment, and Prognosis:  Patient and family agree that short term rehab would be in patient's best interest based on their understanding of patient's diagnosis, treatment and prognosis.   Emotional Assessment Appearance:  Appears stated age Attitude/Demeanor/Rapport:    Affect (typically observed):  Accepting Orientation:  Oriented to Self, Oriented to Place, Oriented to  Time, Oriented to Situation Alcohol / Substance use:   Not Applicable Psych involvement (Current and /or in the community):  No (Comment)  Discharge Needs  Concerns to be addressed:  Discharge Planning Concerns Readmission within the last 30 days:  No Current discharge risk:  None Barriers to Discharge:  No Barriers Identified   Ihor Gully, LCSW 03/24/2018, 2:05 PM

## 2018-03-24 NOTE — Plan of Care (Signed)
  Problem: Acute Rehab PT Goals(only PT should resolve) Goal: Pt Will Go Supine/Side To Sit Outcome: Progressing Flowsheets (Taken 03/24/2018 1233) Pt will go Supine/Side to Sit: with minimal assist Goal: Patient Will Transfer Sit To/From Stand Outcome: Progressing Flowsheets (Taken 03/24/2018 1233) Patient will transfer sit to/from stand: with minimal assist Goal: Pt Will Transfer Bed To Chair/Chair To Bed Outcome: Progressing Flowsheets (Taken 03/24/2018 1233) Pt will Transfer Bed to Chair/Chair to Bed: with min assist Goal: Pt Will Ambulate Outcome: Progressing Flowsheets (Taken 03/24/2018 1233) Pt will Ambulate: 25 feet;with moderate assist;with rolling walker   12:34 PM, 03/24/18 Lonell Grandchild, MPT Physical Therapist with [facility] [phone] office [phone] mobile phone

## 2018-03-24 NOTE — Progress Notes (Signed)
I received a call regarding this patient when I was in the operating room.  I reviewed the images.  She has a nondisplaced high pubic rami fracture.  No surgical intervention is warranted at this point.  She may be weightbearing as tolerated to her left lower extremity.  She does not need to be admitted to [facility].  She can stay at [facility].  She may follow-up with me in 1 month with repeat x-rays of her pelvic ring fracture to see if it is still bothering her.  I relayed this information to the emergency room provider.  Please contact me if there are any questions.  Shona Needles, MD [facility] [phone] (phone)

## 2018-03-24 NOTE — Progress Notes (Signed)
PHARMACIST - PHYSICIAN ORDER COMMUNICATION  CONCERNING: P&T Medication Policy on Herbal Medications  DESCRIPTION:  This patient's order for:  coQ10  has been noted.  This product(s) is classified as an "herbal" or natural product. Due to a lack of definitive safety studies or FDA approval, nonstandard manufacturing practices, plus the potential risk of unknown drug-drug interactions while on inpatient medications, the Pharmacy and Therapeutics Committee does not permit the use of "herbal" or natural products of this type within [facility]   ACTION TAKEN: The pharmacy department is unable to verify this order at this time and your patient has been informed of this safety policy. Please reevaluate patient's clinical condition at discharge and address if the herbal or natural product(s) should be resumed at that time.   Fabio Neighbors, PharmD

## 2018-03-24 NOTE — NC FL2 (Signed)
[place] MEDICAID FL2 LEVEL OF CARE SCREENING TOOL     IDENTIFICATION  Patient Name: Christine Juarez Birthdate: 1920-07-25 Sex: female Admission Date (Current Location): 03/23/2018  [facility] and [place] Number:  [employer] and Address:  [place] [address], [place]      Provider Number: [id number]  Attending Physician Name and Address:  Orson Eva, MD  Relative Name and Phone Number:       Current Level of Care: Hospital Recommended Level of Care: [place] Prior Approval Number:    Date Approved/Denied:   PASRR Number: [id number]  Discharge Plan: SNF    Current Diagnoses: Patient Active Problem List   Diagnosis Date Noted  . Acute respiratory failure with hypoxia ([place]) 03/24/2018  . CAD (coronary artery disease), native coronary artery 03/24/2018  . Closed fracture of left pubis ([place]) 03/23/2018  . Aortic stenosis 08/10/2016  . Diarrhea 05/09/2012  . Rotator cuff syndrome of right shoulder 08/03/2011  . Rotator cuff tear, right 08/03/2011  . TIA 03/12/2008  . HIP PAIN, BILATERAL 03/12/2008  . ASTHMATIC BRONCHITIS, ACUTE 10/25/2007  . PERIPHERAL NEUROPATHY 05/03/2007  . DYSEQUILIBRIUM 05/03/2007  . LEG PAIN 04/14/2007  . ANKLE PAIN, LEFT 03/27/2007  . EDEMA LEG 03/27/2007  . BENIGN POSITIONAL VERTIGO 12/02/2006  . HEADACHE 12/02/2006  . COLONIC POLYPS 10/21/2006  . Hypothyroidism 10/21/2006  . Dyslipidemia 10/21/2006  . COMMON MIGRAINE 10/21/2006  . Essential hypertension 10/21/2006  . Coronary atherosclerosis 10/21/2006  . ASTHMA 10/21/2006  . IRRITABLE BOWEL SYNDROME 10/21/2006  . FIBROCYSTIC BREAST DISEASE 10/21/2006  . DEGENERATIVE DISC DISEASE 10/21/2006  . LOW BACK PAIN 10/21/2006    Orientation RESPIRATION BLADDER Height & Weight     Self, Time, Situation, Place  O2(2L) Continent Weight: 137 lb 12.6 oz (62.5 kg) Height:     BEHAVIORAL SYMPTOMS/MOOD NEUROLOGICAL BOWEL NUTRITION STATUS      Continent (Heart Healthy)  AMBULATORY STATUS COMMUNICATION OF NEEDS Skin   Extensive Assist Verbally Normal                       Personal Care Assistance Level of Assistance  Bathing, Feeding, Dressing Bathing Assistance: Limited assistance Feeding assistance: Independent Dressing Assistance: Limited assistance     Functional Limitations Info  Sight, Hearing, Speech Sight Info: Adequate Hearing Info: Adequate Speech Info: Adequate    SPECIAL CARE FACTORS FREQUENCY  PT (By licensed PT)     PT Frequency: 5x/week              Contractures Contractures Info: Not present    Additional Factors Info  Code Status, Allergies Code Status Info: Full Code Allergies Info: Codeine, Dicylomine Hcl, Erythromycin, Lodine, Meclizine Hcl, Niacin, Statins           Current Medications (03/24/2018):  This is the current hospital active medication list Current Facility-Administered Medications  Medication Dose Route Frequency Provider Last Rate Last Dose  . acetaminophen (TYLENOL) tablet 650 mg  650 mg Oral Q6H PRN Reubin Milan, MD       Or  . acetaminophen (TYLENOL) suppository 650 mg  650 mg Rectal Q6H PRN Reubin Milan, MD      . amLODipine [facility]) tablet 5 mg  5 mg Oral Daily Reubin Milan, MD   5 mg at 03/24/18 3893  . aspirin EC tablet 81 mg  81 mg Oral Daily Reubin Milan, MD   81 mg at 03/24/18 7342  . enoxaparin (LOVENOX)  injection 30 mg  30 mg Subcutaneous Q24H Tat, Shanon Brow, MD   30 mg at 03/24/18 1521  . ezetimibe (ZETIA) tablet 10 mg  10 mg Oral Daily Reubin Milan, MD   10 mg at 03/24/18 0953  . HYDROmorphone (DILAUDID) injection 0.5 mg  0.5 mg Intravenous Q4H PRN Reubin Milan, MD      . isosorbide mononitrate (IMDUR) 24 hr tablet 60 mg  60 mg Oral Daily Reubin Milan, MD   60 mg at 03/24/18 2233  . levothyroxine (SYNTHROID, LEVOTHROID) tablet 88 mcg  88 mcg Oral QAC breakfast Reubin Milan, MD   88 mcg at 03/24/18  [phone]  . ondansetron (ZOFRAN) injection 4 mg  4 mg Intravenous Q6H PRN Reubin Milan, MD      . ondansetron [facility]) tablet 4 mg  4 mg Oral Q6H PRN Reubin Milan, MD       Or  . ondansetron [facility]) injection 4 mg  4 mg Intravenous Q6H PRN Reubin Milan, MD      . pantoprazole (PROTONIX) EC tablet 40 mg  40 mg Oral Daily Reubin Milan, MD   40 mg at 03/24/18 4497     Discharge Medications: Please see discharge summary for a list of discharge medications.  Relevant Imaging Results:  Relevant Lab Results:   Additional Information SSN [id number], [place], [place]

## 2018-03-24 NOTE — ED Provider Notes (Signed)
Dr Doreatha Martin, Orthopedist, states he has reviewed her scans and she can stay at [facility] and do weight bearing as tolerated. He is going to put a note in the chart.   01:15 AM Dr Olevia Bowens, given information about what Dr Doreatha Martin recommended.    Rolland Porter, MD 03/24/18 [phone]

## 2018-03-24 NOTE — Evaluation (Signed)
Physical Therapy Evaluation Patient Details Name: Christine Juarez MRN: [id number] DOB: 1919-11-27 Today's Date: 03/24/2018   History of Present Illness  Christine Juarez is a 84 y.o. female with medical history significant of aortic stenosis, osteoarthritis, CAD/CABG, hypertension, hyperlipidemia, peripheral neuropathy, hypothyroidism who is coming to the emergency department after having an accidental fall at home sustaining injuries to her left hip and left elbow area.  She has substantial tenderness on the left hip and has being unable to ambulate or bear weight since this happened.  She denies fever, chills, sore throat, wheezing, hemoptysis, dyspnea, but complains of occasional cough with mild production of clear yellowish sputum.  No chest pain, palpitations, dizziness, diaphoresis, PND, orthopnea or recent lower extremity edema.  No abdominal pain, nausea, emesis, diarrhea, melena or hematochezia.  He has occasional constipation.  She complains of urinary urgency, but no dysuria, hematuria or oliguria.  Denies heat or cold intolerance.  No polyuria, polydipsia, polyphagia or blurred vision.  Denies having anxiety or feeling sad.    Clinical Impression  Patient requires repeated VC's for safety during functional mobility and taking steps with fair/poor carryover due to mild impulsiveness and/or HOH.  Patient c/o severe left groin pain with LLE movement and when weightbearing on LLE and limited to a few steps at bedside during bed<>BSC transfers.  Patient put back to bed with Max assist to reposition.  Patient will benefit from continued physical therapy in hospital and recommended venue below to increase strength, balance, endurance for safe ADLs and gait.    Follow Up Recommendations SNF;Supervision/Assistance - 24 hour    Equipment Recommendations  None recommended by PT    Recommendations for Other Services       Precautions / Restrictions Precautions Precautions: Fall Restrictions Weight  Bearing Restrictions: No      Mobility  Bed Mobility Overal bed mobility: Needs Assistance Bed Mobility: Supine to Sit;Sit to Supine     Supine to sit: Mod assist Sit to supine: Mod assist      Transfers Overall transfer level: Needs assistance Equipment used: Rolling walker (2 wheeled) Transfers: Sit to/from [facility] Sit to Stand: Mod assist Stand pivot transfers: Mod assist;Max assist       General transfer comment: constant verbal cues to keep hands on RW and put body weight through arms when weightbearing on LLE with fair/poor carryover  Ambulation/Gait Ambulation/Gait assistance: Max assist Ambulation Distance (Feet): 2 Feet Assistive device: Rolling walker (2 wheeled) Gait Pattern/deviations: Decreased step length - left;Decreased stance time - left;Decreased stride length;Decreased step length - right Gait velocity: slow   General Gait Details: limited to 2-3 steps at bedside due to c/o severe pain left groin area when weightbearing on LLE  Stairs            Wheelchair Mobility    Modified Rankin (Stroke Patients Only)       Balance Overall balance assessment: Needs assistance Sitting-balance support: Feet supported;No upper extremity supported Sitting balance-Leahy Scale: Fair     Standing balance support: Bilateral upper extremity supported;During functional activity Standing balance-Leahy Scale: Poor Standing balance comment: using RW                             Pertinent Vitals/Pain Pain Assessment: Faces Faces Pain Scale: Hurts whole lot Pain Location: left groin area with LLE movement or weightbearing Pain Descriptors / Indicators: Sharp;Jabbing;Discomfort;Grimacing;Guarding Pain Intervention(s): Limited activity within patient's tolerance;Monitored during session  Home Living Family/patient expects to be discharged to:: Private residence Living Arrangements: Children(son) Available Help at Discharge:  Family Type of Home: House Home Access: Stairs to enter Entrance Stairs-Rails: None Entrance Stairs-Number of Steps: 1 Home Layout: Two level Home Equipment: [place] - single point;Walker - 2 wheels;Shower seat;Bedside commode      Prior Function Level of Independence: Needs assistance   Gait / Transfers Assistance Needed: household gait with SPC  ADL's / Homemaking Assistance Needed: assisted by her son        Hand Dominance        Extremity/Trunk Assessment   Upper Extremity Assessment Upper Extremity Assessment: Overall WFL for tasks assessed    Lower Extremity Assessment Lower Extremity Assessment: Generalized weakness;RLE deficits/detail;LLE deficits/detail RLE Deficits / Details: grossly 4/5 LLE Deficits / Details: grossly -3/5, limited mostly due to severe pain    Cervical / Trunk Assessment Cervical / Trunk Assessment: Normal  Communication   Communication: No difficulties;HOH  Cognition Arousal/Alertness: Awake/alert Behavior During Therapy: WFL for tasks assessed/performed Overall Cognitive Status: Within Functional Limits for tasks assessed                                        General Comments      Exercises     Assessment/Plan    PT Assessment Patient needs continued PT services  PT Problem List Decreased strength;Decreased activity tolerance;Decreased balance;Decreased mobility;Pain       PT Treatment Interventions Gait training;Stair training;Functional mobility training;Therapeutic activities;Therapeutic exercise;Patient/family education    PT Goals (Current goals can be found in the Care Plan section)  Acute Rehab PT Goals Patient Stated Goal: return home PT Goal Formulation: With patient Time For Goal Achievement: 04/07/18 Potential to Achieve Goals: Good    Frequency Min 3X/week   Barriers to discharge        Co-evaluation               AM-PAC PT "6 Clicks" Daily Activity  Outcome Measure Difficulty  turning over in bed (including adjusting bedclothes, sheets and blankets)?: A Lot Difficulty moving from lying on back to sitting on the side of the bed? : A Lot Difficulty sitting down on and standing up from a chair with arms (e.g., wheelchair, bedside commode, etc,.)?: A Lot Help needed moving to and from a bed to chair (including a wheelchair)?: A Lot Help needed walking in hospital room?: Total Help needed climbing 3-5 steps with a railing? : Total 6 Click Score: 10    End of Session Equipment Utilized During Treatment: Oxygen Activity Tolerance: Patient tolerated treatment well;Patient limited by pain Patient left: in bed;with call bell/phone within reach Nurse Communication: Mobility status PT Visit Diagnosis: Unsteadiness on feet (R26.81);Other abnormalities of gait and mobility (R26.89);Muscle weakness (generalized) (M62.81)    Time: 4627-0350 PT Time Calculation (min) (ACUTE ONLY): 37 min   Charges:   PT Evaluation $PT Eval Moderate Complexity: 1 Mod PT Treatments $Therapeutic Activity: 23-37 mins   PT G Codes:        12:32 PM, 04-21-18 Lonell Grandchild, MPT Physical Therapist with [facility] 336 [phone] office [phone] mobile phone

## 2018-03-25 LAB — BASIC METABOLIC PANEL
Anion gap: 5 (ref 5–15)
BUN: 14 mg/dL (ref 6–20)
CO2: 27 mmol/L (ref 22–32)
Calcium: 8.5 mg/dL — ABNORMAL LOW (ref 8.9–10.3)
Chloride: 106 mmol/L (ref 101–111)
Creatinine, Ser: 0.76 mg/dL (ref 0.44–1.00)
GFR calc Af Amer: >60 mL/min (ref >60)
Glucose, Bld: 104 mg/dL — ABNORMAL HIGH (ref 65–99)
POTASSIUM: 4 mmol/L (ref 3.5–5.1)
Sodium: 138 mmol/L (ref 135–145)

## 2018-03-25 LAB — CBC
HCT: 40.1 % (ref 36.0–46.0)
Hemoglobin: 13.4 g/dL (ref 12.0–15.0)
MCH: 33.3 pg (ref 26.0–34.0)
MCHC: 33.4 g/dL (ref 30.0–36.0)
MCV: 99.5 fL (ref 78.0–100.0)
PLATELETS: 134 10*3/uL — AB (ref 150–400)
RBC: 4.03 MIL/uL (ref 3.87–5.11)
RDW: 12.2 % (ref 11.5–15.5)
WBC: 9.5 10*3/uL (ref 4.0–10.5)

## 2018-03-25 LAB — MAGNESIUM: Magnesium: 1.8 mg/dL (ref 1.7–2.4)

## 2018-03-25 MED ORDER — HYDROCODONE-ACETAMINOPHEN 5-325 MG PO TABS
1.0000 | ORAL_TABLET | ORAL | Status: DC | PRN
  Administered 2018-03-26 (×2): 1 via ORAL
  Filled 2018-03-25 (×2): qty 1

## 2018-03-25 NOTE — Progress Notes (Signed)
PROGRESS NOTE  Christine Juarez PNT:[id number] DOB: Jul 10, 1920 DOA: 03/23/2018 PCP: Celene Squibb, MD   Brief History:  84 year old female with a history of coronary artery disease, hypertension, hyperlipidemia, hypothyroidism, aortic stenosis presenting after mechanical fall getting up from her dining room table.  The patient had significant left hip pain and was unable to bear any weight.  She was brought to the hospital were initial x-ray of the left hip was negative for any pelvic fracture.  However follow-up CT of the left hip revealed an acute non-displaced fracture of the left puboacetabular junction.  Orthopedic surgery, Dr. Doreatha Martin, was consulted who did felt pt could be managed nonoperatively.  The patient has complained of some intermittent shortness of breath and nonproductive cough.  She denies any fevers, chills, chest pain, nausea, vomiting, diarrhea, abdominal pain.  She has had some urinary urgency and urinary retention type symptoms.  She was noted to have WBC 15.8 but was otherwise afebrile hemodynamically stable.  She was noted to be hypoxic with oxygen saturation 88-91%.  As result, the patient was placed on supplemental oxygen.  Assessment/Plan: Closed left pubic rami fracture -Secondary to mechanical fall -Appreciate orthopedics, Dr. Doreatha Martin nonoperative treatment for now -Pain control -PT evaluation>>>SNF  Acute respiratory failure with hypoxia -due to underlying interstitial lung disease -pt likely having hypoxia for some time prior to this admit -CT chest--10 x 14 mm groundglass attenuation RUL nodular density; widespread septal thickening in the periphery of the lung bases; mild cylindrical bronchiectasis and peripheral bronchiolectasis at the lung bases -supplemental oxyygen -start duonebs -presently stable on 2 L--wean for saturation >92%  RLL opacity -Personally reviewed chest x-ray--bibasilar opacities, right greater than left -CT chest--as discussed  above -Check procalcitonin<0.10  Coronary artery disease -No chest pain presently -Personally reviewed EKG--sinus rhythm, nonspecific T wave changes -Continue aspirin, Imdur  Essential hypertension -Continue amlodipine -Blood pressure elevation in part due to pain  Hypothyroidism -continue Synthroid     Disposition Plan:   SNF 03/27/18 Family Communication:   daughter updated at bedside 6/8  Consultants:  Ortho--[place]  Code Status:  FULL   DVT Prophylaxis:  [place] Lovenox   Procedures: As Listed in Progress Note Above  Antibiotics: None     Subjective: Patient complains of pain with any type of weightbearing on her left lower extremity.  Otherwise, she has intermittent shortness of breath with exertion.  She denies any chest pain, nausea or vomiting, diarrhea, abdominal pain, dysuria, hematuria.  Objective: Vitals:   03/24/18 1420 03/24/18 2206 03/25/18 0517 03/25/18 1411  BP: 129/65 (!) 138/55 (!) 146/67 (!) 103/58  Pulse: 69 78 75 82  Resp: 17 20 20 18   Temp: 99.4 F (37.4 C) 98.7 F (37.1 C) 98.4 F (36.9 C) 98.2 F (36.8 C)  TempSrc: Oral Oral Oral   SpO2: 96% 96% 96% 96%  Weight:        Intake/Output Summary (Last 24 hours) at 03/25/2018 1602 Last data filed at 03/25/2018 1500 Gross per 24 hour  Intake 960 ml  Output 300 ml  Net 660 ml   Weight change:  Exam:   General:  Pt is alert, follows commands appropriately, not in acute distress  HEENT: No icterus, No thrush, No neck mass, /AT  Cardiovascular: RRR, S1/S2, no rubs, no gallops  Respiratory: Bibasilar crackles.  No wheezing.  Good air movement.  Abdomen: Soft/+BS, non tender, non distended, no guarding  Extremities: No edema, No lymphangitis, No petechiae, No  rashes, no synovitis   Data Reviewed: I have personally reviewed following labs and imaging studies Basic Metabolic Panel: Recent Labs  Lab 03/23/18 2232 03/24/18 0354 03/25/18 0610  NA 138 140 138  K 3.8  4.4 4.0  CL 105 108 106  CO2 24 25 27   GLUCOSE 115* 132* 104*  BUN 19 17 14   CREATININE 0.83 0.76 0.76  CALCIUM 9.3 8.4* 8.5*  MG 1.9  --  1.8  PHOS 3.1  --   --    Liver Function Tests: Recent Labs  Lab 03/23/18 2232 03/24/18 0354  AST 35 28  ALT 28 25  ALKPHOS 111 89  BILITOT 1.2 0.9  PROT 7.0 5.5*  ALBUMIN 4.1 3.2*   No results for input(s): LIPASE, AMYLASE in the last 168 hours. No results for input(s): AMMONIA in the last 168 hours. Coagulation Profile: Recent Labs  Lab 03/24/18 0354  INR 1.04   CBC: Recent Labs  Lab 03/23/18 2232 03/24/18 0354 03/25/18 0610  WBC 15.8* 12.3* 9.5  NEUTROABS 13.7*  --   --   HGB 15.6* 13.3 13.4  HCT 45.1 40.3 40.1  MCV 97.4 98.3 99.5  PLT 174 161 134*   Cardiac Enzymes: No results for input(s): CKTOTAL, CKMB, CKMBINDEX, TROPONINI in the last 168 hours. BNP: Invalid input(s): POCBNP CBG: No results for input(s): GLUCAP in the last 168 hours. HbA1C: No results for input(s): HGBA1C in the last 72 hours. Urine analysis:    Component Value Date/Time   COLORURINE YELLOW 03/23/2018 [place] 03/23/2018 2315   LABSPEC 1.010 03/23/2018 2315   PHURINE 5.0 03/23/2018 2315   GLUCOSEU NEGATIVE 03/23/2018 2315   HGBUR NEGATIVE 03/23/2018 2315   HGBUR negative 03/27/2007 1304   BILIRUBINUR NEGATIVE 03/23/2018 2315   KETONESUR NEGATIVE 03/23/2018 2315   PROTEINUR NEGATIVE 03/23/2018 2315   UROBILINOGEN 1.0 03/27/2007 1304   NITRITE NEGATIVE 03/23/2018 2315   LEUKOCYTESUR TRACE (A) 03/23/2018 2315   Sepsis Labs: @LABRCNTIP (procalcitonin:4,lacticidven:4) )No results found for this or any previous visit (from the past 240 hour(s)).   Scheduled Meds: . amLODipine  5 mg Oral Daily  . aspirin EC  81 mg Oral Daily  . enoxaparin (LOVENOX) injection  30 mg Subcutaneous Q24H  . ezetimibe  10 mg Oral Daily  . isosorbide mononitrate  60 mg Oral Daily  . levothyroxine  88 mcg Oral QAC breakfast  . pantoprazole  40  mg Oral Daily   Continuous Infusions:  Procedures/Studies: Dg Chest 2 View  Result Date: 03/23/2018 CLINICAL DATA:  Fall today. EXAM: CHEST - 2 VIEW COMPARISON:  Chest radiograph 10/29/2010 FINDINGS: Post median sternotomy and CABG. Unchanged cardiomegaly. Atherosclerosis of the thoracic aorta. Lower lung volumes from prior exam leading to bronchovascular crowding. No pneumothorax or focal airspace disease. No pulmonary edema or pleural fluid. No visualized rib fracture or acute osseous abnormality. Bones are under mineralized. IMPRESSION: 1. No acute findings. 2. Post CABG with stable cardiomegaly. Electronically Signed   By: Jeb Levering M.D.   On: 03/23/2018 19:20   Ct Chest Wo Contrast  Result Date: 03/24/2018 CLINICAL DATA:  84 year old female with abnormal lung sounds. Recent pubic ramus fracture. EXAM: CT CHEST WITHOUT CONTRAST TECHNIQUE: Multidetector CT imaging of the chest was performed following the standard protocol without IV contrast. COMPARISON:  No priors. FINDINGS: Cardiovascular: Heart size is normal. There is no significant pericardial fluid, thickening or pericardial calcification. There is aortic atherosclerosis, as well as atherosclerosis of the great vessels of the mediastinum and the  coronary arteries, including calcified atherosclerotic plaque in the left main, left anterior descending, left circumflex and right coronary arteries. Status post median sternotomy for CABG including LIMA to the LAD. Severe calcifications of the aortic valve. Mediastinum/Nodes: No pathologically enlarged mediastinal or hilar lymph nodes. Please note that accurate exclusion of hilar adenopathy is limited on noncontrast CT scans. Small hiatal hernia. No axillary lymphadenopathy. Lungs/Pleura: 10 x 14 mm ground-glass attenuation nodule in the right upper lobe near the apex (axial image 31 of series 4). No other suspicious appearing pulmonary nodules or masses are noted. No acute consolidative airspace  disease. No pleural effusions. Widespread septal thickening throughout the periphery of the lung bases. Mild cylindrical bronchiectasis and peripheral bronchiolectasis also noted in the lung bases. Upper Abdomen: Aortic atherosclerosis. Calcified granuloma in the left lobe of the liver. Musculoskeletal: Nondisplaced fracture of the anterolateral aspect of the left sixth rib (axial image 94 of series 4). Median sternotomy wires. There are no aggressive appearing lytic or blastic lesions noted in the visualized portions of the skeleton. IMPRESSION: 1. The appearance of the lungs suggests potential interstitial lung disease. Outpatient referral to Pulmonology for further evaluation is recommended. 2. 10 x 14 mm ground-glass attenuation nodule in the right upper lobe near the apex. No central solid component at this time to suggest an invasive malignancy. However, attention on any future follow-up chest CTs is suggested. 3. Aortic atherosclerosis, in addition to left main and 3 vessel coronary artery disease. Status post median sternotomy for CABG including LIMA to the LAD. 4. Small hiatal hernia. Aortic Atherosclerosis (ICD10-I70.0). Electronically Signed   By: Vinnie Langton M.D.   On: 03/24/2018 11:36   Ct Hip Left Wo Contrast  Result Date: 03/23/2018 CLINICAL DATA:  Left forearm pain after fall. EXAM: CT OF THE LEFT HIP WITHOUT CONTRAST TECHNIQUE: Multidetector CT imaging of the left hip was performed according to the standard protocol. Multiplanar CT image reconstructions were also generated. COMPARISON:  Left hip x-rays from same day. FINDINGS: Bones/Joint/Cartilage Acute, nondisplaced fracture of the left puboacetabular junction. Slightly angulated deformity of the left inferior pubic ramus without discrete fracture line may reflect an old healed fracture. No femur fracture. No dislocation. Diffuse osteopenia. No hip joint effusion. The hip joint space is well preserved. Ligaments Suboptimally assessed by  CT. Muscles and Tendons Mild atrophy of the gluteus minimus muscle. The visualized left gluteal, iliopsoas, and hamstring tendons are unremarkable. Soft tissues Minimal subcutaneous fat stranding over the lateral hip. No soft tissue mass or fluid collection. Extensive sigmoid diverticulosis. Small fat containing left inguinal hernia. IMPRESSION: 1. Acute, nondisplaced fracture of the left puboacetabular junction. Electronically Signed   By: Titus Dubin M.D.   On: 03/23/2018 22:06   Dg Hip Unilat W Or Wo Pelvis 2-3 Views Left  Result Date: 03/23/2018 CLINICAL DATA:  Fall today with left hip pain. EXAM: DG HIP (WITH OR WITHOUT PELVIS) 2-3V LEFT COMPARISON:  None. FINDINGS: The cortical margins of the bony pelvis and left hip are intact. No fracture. Pubic symphysis and sacroiliac joints are congruent. Both femoral heads are well-seated in the respective acetabula. Bones are under mineralized. IMPRESSION: No pelvic or left hip fracture. Electronically Signed   By: Jeb Levering M.D.   On: 03/23/2018 19:18    Orson Eva, DO  [facility] Pager [phone]  If 7PM-7AM, please contact night-coverage [email or web address] Password [email or web address] 03/25/2018, 4:02 PM   LOS: 2 days

## 2018-03-25 NOTE — Plan of Care (Signed)
Progressing. Patient is struggling with the concept of the [place]. We discussed how it works and why she has it. Patient is less anxious about using it.

## 2018-03-26 MED ORDER — HYDROCODONE-ACETAMINOPHEN 5-325 MG PO TABS: 1.0000 | ORAL_TABLET | ORAL | 0 refills | Status: DC | PRN

## 2018-03-26 NOTE — Discharge Summary (Signed)
Physician Discharge Summary  BRIDIE COLQUHOUN NFA:[id number] DOB: 28-Sep-1920 DOA: 03/23/2018  PCP: Celene Squibb, MD  Admit date: 03/23/2018 Discharge date: 03/27/2018  Admitted From: Home Disposition:  Home   Recommendations for Outpatient Follow-up:  1. Follow up with PCP in 1-2 weeks 2. Please obtain BMP/CBC in one week 3. Place pt on 2L [place], wean oxygen for saturation >92% 4. Follow up with orthopedics, Dr. Lennette Bihari Haddix, in one month   Discharge Condition: Stable CODE STATUS: FULL Diet recommendation:Regular    Brief/Interim Summary: 84 year old female with a history of coronary artery disease, hypertension, hyperlipidemia, hypothyroidism, aortic stenosis presenting after mechanical fall getting up from her dining room table. The patient had significant left hip pain and was unable to bear any weight. She was brought to the hospital were initial x-ray of the left hip was negative for any pelvic fracture. However follow-up CT of the left hip revealed an acute non-displaced fracture of the left puboacetabular junction. Orthopedic surgery, Dr. Doreatha Martin, was consulted who did felt pt could be managed nonoperatively.The patient has complained of some intermittent shortness of breath and nonproductive cough. She denies any fevers, chills, chest pain, nausea, vomiting, diarrhea, abdominal pain. She has had some urinary urgency and urinary retention type symptoms. She was noted to have WBC 15.8but was otherwise afebrile hemodynamically stable. She was noted to be hypoxic with oxygen saturation 88-91%. As result, the patient was placed on supplemental oxygen.   Discharge Diagnoses:  Closed left pubic rami fracture -Secondary to mechanical fall -Appreciate orthopedics, Dr. Caesar Bookman treatment for now -Pain control -PT evaluation>>>SNF  Acute respiratory failure with hypoxia -due to underlying interstitial lung disease -pt likely having hypoxia for some time prior to this  admit -CT chest--10 x 14 mm groundglass attenuationRUL nodular density;widespread septal thickening in the periphery of the lung bases; mild cylindrical bronchiectasis and peripheral bronchiolectasis at the lung bases -supplemental oxyygen -started duonebs -presently stable on 2 L--wean for saturation >92%  RLL opacity/interstitial lung disease -Personally reviewed chest x-ray--bibasilar opacities, right greater than left -CT chest--as discussed above -Check procalcitonin<0.10 -CT chest as discussed above -will need outpt pulmonary eval and surveillance CT vs high res CT chest -ANA--pending at time of d/c -alpha-1-antitriypsin--pending at time of d/c  Coronary artery disease -No chest pain presently -Personally reviewed EKG--sinus rhythm, nonspecific T wave changes -Continue aspirin, Imdur  Essential hypertension -Continue amlodipine -Blood pressure elevation in part due to pain  Hypothyroidism -continue Synthroid  Thrombocytopenia -B12--pending at time of d/c -TSH--pending at time of d/c   Discharge Instructions   Allergies as of 03/27/2018      Reactions   Codeine    UNKNOWN   Dicyclomine Hcl    UNKNOWN   Erythromycin    UNKNOWN   Lodine [etodolac]    Meclizine Hcl    amnesia   Niacin    UNKNOWN   Statins    Muscle pain      Medication List    TAKE these medications   aspirin 81 MG tablet Take 81 mg by mouth daily.   CoQ10 100 MG Caps Take 1 capsule by mouth daily.   ezetimibe 10 MG tablet Commonly known as:  ZETIA TAKE 1 TABLET BY MOUTH ONCE DAILY FOR CHOLESTEROL.   HYDROcodone-acetaminophen 5-325 MG tablet Commonly known as:  NORCO/VICODIN Take 1 tablet by mouth every 4 (four) hours as needed for moderate pain.   isosorbide mononitrate 60 MG 24 hr tablet Commonly known as:  IMDUR TAKE 1 TABLET BY MOUTH ONCE DAILY.  NORVASC 5 MG tablet Generic drug:  amLODipine TAKE (1) TABLET BY MOUTH ONCE DAILY.   OCUVITE PO Take 1 tablet by  mouth daily.   omeprazole 20 MG capsule Commonly known as:  PRILOSEC Take 20 mg by mouth daily.   SYNTHROID 88 MCG tablet Generic drug:  levothyroxine Take 88 mcg by mouth daily before breakfast.       Contact information for follow-up providers    Haddix, Thomasene Lot, MD Follow up in 1 month(s).   Specialty:  Orthopedic Surgery Contact information: [place] [address] [phone]            Contact information for after-discharge care    [facility] .   Service:  Skilled Nursing Contact information: [address] [address] [phone]                 Allergies  Allergen Reactions  . Codeine     UNKNOWN  . Dicyclomine Hcl     UNKNOWN  . Erythromycin     UNKNOWN  . Lodine [Etodolac]   . Meclizine Hcl     amnesia  . Niacin     UNKNOWN  . Statins     Muscle pain    Consultations:  orthopedics   Procedures/Studies: Dg Chest 2 View  Result Date: 03/23/2018 CLINICAL DATA:  Fall today. EXAM: CHEST - 2 VIEW COMPARISON:  Chest radiograph 10/29/2010 FINDINGS: Post median sternotomy and CABG. Unchanged cardiomegaly. Atherosclerosis of the thoracic aorta. Lower lung volumes from prior exam leading to bronchovascular crowding. No pneumothorax or focal airspace disease. No pulmonary edema or pleural fluid. No visualized rib fracture or acute osseous abnormality. Bones are under mineralized. IMPRESSION: 1. No acute findings. 2. Post CABG with stable cardiomegaly. Electronically Signed   By: Jeb Levering M.D.   On: 03/23/2018 19:20   Ct Chest Wo Contrast  Result Date: 03/24/2018 CLINICAL DATA:  84 year old female with abnormal lung sounds. Recent pubic ramus fracture. EXAM: CT CHEST WITHOUT CONTRAST TECHNIQUE: Multidetector CT imaging of the chest was performed following the standard protocol without IV contrast. COMPARISON:  No priors. FINDINGS: Cardiovascular: Heart size is  normal. There is no significant pericardial fluid, thickening or pericardial calcification. There is aortic atherosclerosis, as well as atherosclerosis of the great vessels of the mediastinum and the coronary arteries, including calcified atherosclerotic plaque in the left main, left anterior descending, left circumflex and right coronary arteries. Status post median sternotomy for CABG including LIMA to the LAD. Severe calcifications of the aortic valve. Mediastinum/Nodes: No pathologically enlarged mediastinal or hilar lymph nodes. Please note that accurate exclusion of hilar adenopathy is limited on noncontrast CT scans. Small hiatal hernia. No axillary lymphadenopathy. Lungs/Pleura: 10 x 14 mm ground-glass attenuation nodule in the right upper lobe near the apex (axial image 31 of series 4). No other suspicious appearing pulmonary nodules or masses are noted. No acute consolidative airspace disease. No pleural effusions. Widespread septal thickening throughout the periphery of the lung bases. Mild cylindrical bronchiectasis and peripheral bronchiolectasis also noted in the lung bases. Upper Abdomen: Aortic atherosclerosis. Calcified granuloma in the left lobe of the liver. Musculoskeletal: Nondisplaced fracture of the anterolateral aspect of the left sixth rib (axial image 94 of series 4). Median sternotomy wires. There are no aggressive appearing lytic or blastic lesions noted in the visualized portions of the skeleton. IMPRESSION: 1. The appearance of the lungs suggests potential interstitial lung disease. Outpatient  referral to Pulmonology for further evaluation is recommended. 2. 10 x 14 mm ground-glass attenuation nodule in the right upper lobe near the apex. No central solid component at this time to suggest an invasive malignancy. However, attention on any future follow-up chest CTs is suggested. 3. Aortic atherosclerosis, in addition to left main and 3 vessel coronary artery disease. Status post median  sternotomy for CABG including LIMA to the LAD. 4. Small hiatal hernia. Aortic Atherosclerosis (ICD10-I70.0). Electronically Signed   By: Vinnie Langton M.D.   On: 03/24/2018 11:36   Ct Hip Left Wo Contrast  Result Date: 03/23/2018 CLINICAL DATA:  Left forearm pain after fall. EXAM: CT OF THE LEFT HIP WITHOUT CONTRAST TECHNIQUE: Multidetector CT imaging of the left hip was performed according to the standard protocol. Multiplanar CT image reconstructions were also generated. COMPARISON:  Left hip x-rays from same day. FINDINGS: Bones/Joint/Cartilage Acute, nondisplaced fracture of the left puboacetabular junction. Slightly angulated deformity of the left inferior pubic ramus without discrete fracture line may reflect an old healed fracture. No femur fracture. No dislocation. Diffuse osteopenia. No hip joint effusion. The hip joint space is well preserved. Ligaments Suboptimally assessed by CT. Muscles and Tendons Mild atrophy of the gluteus minimus muscle. The visualized left gluteal, iliopsoas, and hamstring tendons are unremarkable. Soft tissues Minimal subcutaneous fat stranding over the lateral hip. No soft tissue mass or fluid collection. Extensive sigmoid diverticulosis. Small fat containing left inguinal hernia. IMPRESSION: 1. Acute, nondisplaced fracture of the left puboacetabular junction. Electronically Signed   By: Titus Dubin M.D.   On: 03/23/2018 22:06   Dg Hip Unilat W Or Wo Pelvis 2-3 Views Left  Result Date: 03/23/2018 CLINICAL DATA:  Fall today with left hip pain. EXAM: DG HIP (WITH OR WITHOUT PELVIS) 2-3V LEFT COMPARISON:  None. FINDINGS: The cortical margins of the bony pelvis and left hip are intact. No fracture. Pubic symphysis and sacroiliac joints are congruent. Both femoral heads are well-seated in the respective acetabula. Bones are under mineralized. IMPRESSION: No pelvic or left hip fracture. Electronically Signed   By: Jeb Levering M.D.   On: 03/23/2018 19:18         Discharge Exam: Vitals:   03/26/18 2215 03/27/18 0619  BP: (!) 141/69 (!) 149/79  Pulse: 80 79  Resp: 16 15  Temp: 98.9 F (37.2 C) 98.8 F (37.1 C)  SpO2: 93% 94%   Vitals:   03/26/18 0535 03/26/18 1500 03/26/18 2215 03/27/18 0619  BP: 139/66 (!) 140/56 (!) 141/69 (!) 149/79  Pulse: 74 83 80 79  Resp: 15 16 16 15   Temp: 98.2 F (36.8 C) 98 F (36.7 C) 98.9 F (37.2 C) 98.8 F (37.1 C)  TempSrc: Oral Oral Oral Oral  SpO2: 95% 93% 93% 94%  Weight:      Height:        General: Pt is alert, awake, not in acute distress Cardiovascular: RRR, S1/S2 +, no rubs, no gallops Respiratory: CTA bilaterally, no wheezing, no rhonchi Abdominal: Soft, NT, ND, bowel sounds + Extremities: no edema, no cyanosis   The results of significant diagnostics from this hospitalization (including imaging, microbiology, ancillary and laboratory) are listed below for reference.    Significant Diagnostic Studies: Dg Chest 2 View  Result Date: 03/23/2018 CLINICAL DATA:  Fall today. EXAM: CHEST - 2 VIEW COMPARISON:  Chest radiograph 10/29/2010 FINDINGS: Post median sternotomy and CABG. Unchanged cardiomegaly. Atherosclerosis of the thoracic aorta. Lower lung volumes from prior exam leading to bronchovascular crowding. No pneumothorax  or focal airspace disease. No pulmonary edema or pleural fluid. No visualized rib fracture or acute osseous abnormality. Bones are under mineralized. IMPRESSION: 1. No acute findings. 2. Post CABG with stable cardiomegaly. Electronically Signed   By: Jeb Levering M.D.   On: 03/23/2018 19:20   Ct Chest Wo Contrast  Result Date: 03/24/2018 CLINICAL DATA:  84 year old female with abnormal lung sounds. Recent pubic ramus fracture. EXAM: CT CHEST WITHOUT CONTRAST TECHNIQUE: Multidetector CT imaging of the chest was performed following the standard protocol without IV contrast. COMPARISON:  No priors. FINDINGS: Cardiovascular: Heart size is normal. There is no  significant pericardial fluid, thickening or pericardial calcification. There is aortic atherosclerosis, as well as atherosclerosis of the great vessels of the mediastinum and the coronary arteries, including calcified atherosclerotic plaque in the left main, left anterior descending, left circumflex and right coronary arteries. Status post median sternotomy for CABG including LIMA to the LAD. Severe calcifications of the aortic valve. Mediastinum/Nodes: No pathologically enlarged mediastinal or hilar lymph nodes. Please note that accurate exclusion of hilar adenopathy is limited on noncontrast CT scans. Small hiatal hernia. No axillary lymphadenopathy. Lungs/Pleura: 10 x 14 mm ground-glass attenuation nodule in the right upper lobe near the apex (axial image 31 of series 4). No other suspicious appearing pulmonary nodules or masses are noted. No acute consolidative airspace disease. No pleural effusions. Widespread septal thickening throughout the periphery of the lung bases. Mild cylindrical bronchiectasis and peripheral bronchiolectasis also noted in the lung bases. Upper Abdomen: Aortic atherosclerosis. Calcified granuloma in the left lobe of the liver. Musculoskeletal: Nondisplaced fracture of the anterolateral aspect of the left sixth rib (axial image 94 of series 4). Median sternotomy wires. There are no aggressive appearing lytic or blastic lesions noted in the visualized portions of the skeleton. IMPRESSION: 1. The appearance of the lungs suggests potential interstitial lung disease. Outpatient referral to Pulmonology for further evaluation is recommended. 2. 10 x 14 mm ground-glass attenuation nodule in the right upper lobe near the apex. No central solid component at this time to suggest an invasive malignancy. However, attention on any future follow-up chest CTs is suggested. 3. Aortic atherosclerosis, in addition to left main and 3 vessel coronary artery disease. Status post median sternotomy for CABG  including LIMA to the LAD. 4. Small hiatal hernia. Aortic Atherosclerosis (ICD10-I70.0). Electronically Signed   By: Vinnie Langton M.D.   On: 03/24/2018 11:36   Ct Hip Left Wo Contrast  Result Date: 03/23/2018 CLINICAL DATA:  Left forearm pain after fall. EXAM: CT OF THE LEFT HIP WITHOUT CONTRAST TECHNIQUE: Multidetector CT imaging of the left hip was performed according to the standard protocol. Multiplanar CT image reconstructions were also generated. COMPARISON:  Left hip x-rays from same day. FINDINGS: Bones/Joint/Cartilage Acute, nondisplaced fracture of the left puboacetabular junction. Slightly angulated deformity of the left inferior pubic ramus without discrete fracture line may reflect an old healed fracture. No femur fracture. No dislocation. Diffuse osteopenia. No hip joint effusion. The hip joint space is well preserved. Ligaments Suboptimally assessed by CT. Muscles and Tendons Mild atrophy of the gluteus minimus muscle. The visualized left gluteal, iliopsoas, and hamstring tendons are unremarkable. Soft tissues Minimal subcutaneous fat stranding over the lateral hip. No soft tissue mass or fluid collection. Extensive sigmoid diverticulosis. Small fat containing left inguinal hernia. IMPRESSION: 1. Acute, nondisplaced fracture of the left puboacetabular junction. Electronically Signed   By: Titus Dubin M.D.   On: 03/23/2018 22:06   Dg Hip Unilat W Or Wo  Pelvis 2-3 Views Left  Result Date: 03/23/2018 CLINICAL DATA:  Fall today with left hip pain. EXAM: DG HIP (WITH OR WITHOUT PELVIS) 2-3V LEFT COMPARISON:  None. FINDINGS: The cortical margins of the bony pelvis and left hip are intact. No fracture. Pubic symphysis and sacroiliac joints are congruent. Both femoral heads are well-seated in the respective acetabula. Bones are under mineralized. IMPRESSION: No pelvic or left hip fracture. Electronically Signed   By: Jeb Levering M.D.   On: 03/23/2018 19:18     Microbiology: No results  found for this or any previous visit (from the past 240 hour(s)).   Labs: Basic Metabolic Panel: Recent Labs  Lab 03/23/18 2232 03/24/18 0354 03/25/18 0610 03/27/18 0635  NA 138 140 138 139  K 3.8 4.4 4.0 4.0  CL 105 108 106 105  CO2 24 25 27 27   GLUCOSE 115* 132* 104* 111*  BUN 19 17 14 20   CREATININE 0.83 0.76 0.76 0.79  CALCIUM 9.3 8.4* 8.5* 8.7*  MG 1.9  --  1.8  --   PHOS 3.1  --   --   --    Liver Function Tests: Recent Labs  Lab 03/23/18 2232 03/24/18 0354  AST 35 28  ALT 28 25  ALKPHOS 111 89  BILITOT 1.2 0.9  PROT 7.0 5.5*  ALBUMIN 4.1 3.2*   No results for input(s): LIPASE, AMYLASE in the last 168 hours. No results for input(s): AMMONIA in the last 168 hours. CBC: Recent Labs  Lab 03/23/18 2232 03/24/18 0354 03/25/18 0610 03/27/18 0635  WBC 15.8* 12.3* 9.5 7.9  NEUTROABS 13.7*  --   --   --   HGB 15.6* 13.3 13.4 13.2  HCT 45.1 40.3 40.1 40.1  MCV 97.4 98.3 99.5 98.8  PLT 174 161 134* 142*   Cardiac Enzymes: No results for input(s): CKTOTAL, CKMB, CKMBINDEX, TROPONINI in the last 168 hours. BNP: Invalid input(s): POCBNP CBG: No results for input(s): GLUCAP in the last 168 hours.  Time coordinating discharge:  36 minutes  Signed:  Orson Eva, DO [facility] Pager: [phone] 03/27/2018, 12:24 PM

## 2018-03-26 NOTE — Progress Notes (Signed)
PROGRESS NOTE  Christine Juarez MWU:[id number] DOB: 01-07-1920 DOA: 03/23/2018 PCP: Celene Squibb, MD   Brief History: 84 year old female with a history of coronary artery disease, hypertension, hyperlipidemia, hypothyroidism, aortic stenosis presenting after mechanical fall getting up from her dining room table. The patient had significant left hip pain and was unable to bear any weight. She was brought to the hospital were initial x-ray of the left hip was negative for any pelvic fracture. However follow-up CT of the left hip revealed an acute non-displaced fracture of the left puboacetabular junction. Orthopedic surgery, Dr. Doreatha Martin, was consulted who did felt pt could be managed nonoperatively.The patient has complained of some intermittent shortness of breath and nonproductive cough. She denies any fevers, chills, chest pain, nausea, vomiting, diarrhea, abdominal pain. She has had some urinary urgency and urinary retention type symptoms. She was noted to have WBC 15.8but was otherwise afebrile hemodynamically stable. She was noted to be hypoxic with oxygen saturation 88-91%. As result, the patient was placed on supplemental oxygen.  Assessment/Plan: Closed left pubic rami fracture -Secondary to mechanical fall -Appreciate orthopedics, Dr. Caesar Bookman treatment for now -Pain control -PT evaluation>>>SNF  Acute respiratory failure with hypoxia -due to underlying interstitial lung disease -pt likely having hypoxia for some time prior to this admit -CT chest--10 x 14 mm groundglass attenuation RUL nodular density; widespread septal thickening in the periphery of the lung bases; mild cylindrical bronchiectasis and peripheral bronchiolectasis at the lung bases -supplemental oxyygen -start duonebs -presently stable on 2 L--wean for saturation >92%  RLL opacity -Personally reviewed chest x-ray--bibasilar opacities, right greater than left -CT chest--as discussed  above -Check procalcitonin<0.10 -will need outpt pulmonary eval and surveillance CT vs high res CT chest  Coronary artery disease -No chest pain presently -Personally reviewed EKG--sinus rhythm, nonspecific T wave changes -Continue aspirin, Imdur  Essential hypertension -Continue amlodipine -Blood pressure elevation in part due to pain  Hypothyroidism -continue Synthroid     Disposition Plan:SNF 03/27/18 Family Communication:daughter updated at bedside 6/9  Consultants:Ortho--[place]  Code Status: FULL   DVT Prophylaxis: Owen Lovenox   Procedures: As Listed in Progress Note Above  Antibiotics: None       Subjective: Patient states overall pain is stable.  She still has significant pain with any type of weightbearing to her left hip.  She denies any fevers, chills, headache, chest pain, shortness breath, nausea, vomiting, diarrhea, abdominal pain.  She has not had a bowel movement since admission.  Objective: Vitals:   03/25/18 0517 03/25/18 1411 03/25/18 2215 03/26/18 0535  BP: (!) 146/67 (!) 103/58 140/71 139/66  Pulse: 75 82 82 74  Resp: 20 18 16 15   Temp: 98.4 F (36.9 C) 98.2 F (36.8 C) 98.9 F (37.2 C) 98.2 F (36.8 C)  TempSrc: Oral  Oral Oral  SpO2: 96% 96% 92% 95%  Weight:      Height:        Intake/Output Summary (Last 24 hours) at 03/26/2018 1348 Last data filed at 03/25/2018 2300 Gross per 24 hour  Intake 680 ml  Output 500 ml  Net 180 ml   Weight change:  Exam:   General:  Pt is alert, follows commands appropriately, not in acute distress  HEENT: No icterus, No thrush, No neck mass, [place]/AT  Cardiovascular: RRR, S1/S2, no rubs, no gallops  Respiratory: bibasilar crackles, no wheeze  Abdomen: Soft/+BS, non tender, non distended, no guarding  Extremities: No edema, No lymphangitis, No petechiae, No  rashes, no synovitis   Data Reviewed: I have personally reviewed following labs and imaging studies Basic  Metabolic Panel: Recent Labs  Lab 03/23/18 2232 03/24/18 0354 03/25/18 0610  NA 138 140 138  K 3.8 4.4 4.0  CL 105 108 106  CO2 24 25 27   GLUCOSE 115* 132* 104*  BUN 19 17 14   CREATININE 0.83 0.76 0.76  CALCIUM 9.3 8.4* 8.5*  MG 1.9  --  1.8  PHOS 3.1  --   --    Liver Function Tests: Recent Labs  Lab 03/23/18 2232 03/24/18 0354  AST 35 28  ALT 28 25  ALKPHOS 111 89  BILITOT 1.2 0.9  PROT 7.0 5.5*  ALBUMIN 4.1 3.2*   No results for input(s): LIPASE, AMYLASE in the last 168 hours. No results for input(s): AMMONIA in the last 168 hours. Coagulation Profile: Recent Labs  Lab 03/24/18 0354  INR 1.04   CBC: Recent Labs  Lab 03/23/18 2232 03/24/18 0354 03/25/18 0610  WBC 15.8* 12.3* 9.5  NEUTROABS 13.7*  --   --   HGB 15.6* 13.3 13.4  HCT 45.1 40.3 40.1  MCV 97.4 98.3 99.5  PLT 174 161 134*   Cardiac Enzymes: No results for input(s): CKTOTAL, CKMB, CKMBINDEX, TROPONINI in the last 168 hours. BNP: Invalid input(s): POCBNP CBG: No results for input(s): GLUCAP in the last 168 hours. HbA1C: No results for input(s): HGBA1C in the last 72 hours. Urine analysis:    Component Value Date/Time   COLORURINE YELLOW 03/23/2018 [place] 03/23/2018 2315   LABSPEC 1.010 03/23/2018 2315   PHURINE 5.0 03/23/2018 2315   GLUCOSEU NEGATIVE 03/23/2018 2315   HGBUR NEGATIVE 03/23/2018 2315   HGBUR negative 03/27/2007 1304   BILIRUBINUR NEGATIVE 03/23/2018 2315   KETONESUR NEGATIVE 03/23/2018 2315   PROTEINUR NEGATIVE 03/23/2018 2315   UROBILINOGEN 1.0 03/27/2007 1304   NITRITE NEGATIVE 03/23/2018 2315   LEUKOCYTESUR TRACE (A) 03/23/2018 2315   Sepsis Labs: @LABRCNTIP (procalcitonin:4,lacticidven:4) )No results found for this or any previous visit (from the past 240 hour(s)).   Scheduled Meds: . amLODipine  5 mg Oral Daily  . aspirin EC  81 mg Oral Daily  . enoxaparin (LOVENOX) injection  30 mg Subcutaneous Q24H  . ezetimibe  10 mg Oral Daily  .  isosorbide mononitrate  60 mg Oral Daily  . levothyroxine  88 mcg Oral QAC breakfast  . pantoprazole  40 mg Oral Daily   Continuous Infusions:  Procedures/Studies: Dg Chest 2 View  Result Date: 03/23/2018 CLINICAL DATA:  Fall today. EXAM: CHEST - 2 VIEW COMPARISON:  Chest radiograph 10/29/2010 FINDINGS: Post median sternotomy and CABG. Unchanged cardiomegaly. Atherosclerosis of the thoracic aorta. Lower lung volumes from prior exam leading to bronchovascular crowding. No pneumothorax or focal airspace disease. No pulmonary edema or pleural fluid. No visualized rib fracture or acute osseous abnormality. Bones are under mineralized. IMPRESSION: 1. No acute findings. 2. Post CABG with stable cardiomegaly. Electronically Signed   By: Jeb Levering M.D.   On: 03/23/2018 19:20   Ct Chest Wo Contrast  Result Date: 03/24/2018 CLINICAL DATA:  84 year old female with abnormal lung sounds. Recent pubic ramus fracture. EXAM: CT CHEST WITHOUT CONTRAST TECHNIQUE: Multidetector CT imaging of the chest was performed following the standard protocol without IV contrast. COMPARISON:  No priors. FINDINGS: Cardiovascular: Heart size is normal. There is no significant pericardial fluid, thickening or pericardial calcification. There is aortic atherosclerosis, as well as atherosclerosis of the great vessels of the mediastinum and the  coronary arteries, including calcified atherosclerotic plaque in the left main, left anterior descending, left circumflex and right coronary arteries. Status post median sternotomy for CABG including LIMA to the LAD. Severe calcifications of the aortic valve. Mediastinum/Nodes: No pathologically enlarged mediastinal or hilar lymph nodes. Please note that accurate exclusion of hilar adenopathy is limited on noncontrast CT scans. Small hiatal hernia. No axillary lymphadenopathy. Lungs/Pleura: 10 x 14 mm ground-glass attenuation nodule in the right upper lobe near the apex (axial image 31 of series  4). No other suspicious appearing pulmonary nodules or masses are noted. No acute consolidative airspace disease. No pleural effusions. Widespread septal thickening throughout the periphery of the lung bases. Mild cylindrical bronchiectasis and peripheral bronchiolectasis also noted in the lung bases. Upper Abdomen: Aortic atherosclerosis. Calcified granuloma in the left lobe of the liver. Musculoskeletal: Nondisplaced fracture of the anterolateral aspect of the left sixth rib (axial image 94 of series 4). Median sternotomy wires. There are no aggressive appearing lytic or blastic lesions noted in the visualized portions of the skeleton. IMPRESSION: 1. The appearance of the lungs suggests potential interstitial lung disease. Outpatient referral to Pulmonology for further evaluation is recommended. 2. 10 x 14 mm ground-glass attenuation nodule in the right upper lobe near the apex. No central solid component at this time to suggest an invasive malignancy. However, attention on any future follow-up chest CTs is suggested. 3. Aortic atherosclerosis, in addition to left main and 3 vessel coronary artery disease. Status post median sternotomy for CABG including LIMA to the LAD. 4. Small hiatal hernia. Aortic Atherosclerosis (ICD10-I70.0). Electronically Signed   By: Vinnie Langton M.D.   On: 03/24/2018 11:36   Ct Hip Left Wo Contrast  Result Date: 03/23/2018 CLINICAL DATA:  Left forearm pain after fall. EXAM: CT OF THE LEFT HIP WITHOUT CONTRAST TECHNIQUE: Multidetector CT imaging of the left hip was performed according to the standard protocol. Multiplanar CT image reconstructions were also generated. COMPARISON:  Left hip x-rays from same day. FINDINGS: Bones/Joint/Cartilage Acute, nondisplaced fracture of the left puboacetabular junction. Slightly angulated deformity of the left inferior pubic ramus without discrete fracture line may reflect an old healed fracture. No femur fracture. No dislocation. Diffuse  osteopenia. No hip joint effusion. The hip joint space is well preserved. Ligaments Suboptimally assessed by CT. Muscles and Tendons Mild atrophy of the gluteus minimus muscle. The visualized left gluteal, iliopsoas, and hamstring tendons are unremarkable. Soft tissues Minimal subcutaneous fat stranding over the lateral hip. No soft tissue mass or fluid collection. Extensive sigmoid diverticulosis. Small fat containing left inguinal hernia. IMPRESSION: 1. Acute, nondisplaced fracture of the left puboacetabular junction. Electronically Signed   By: Titus Dubin M.D.   On: 03/23/2018 22:06   Dg Hip Unilat W Or Wo Pelvis 2-3 Views Left  Result Date: 03/23/2018 CLINICAL DATA:  Fall today with left hip pain. EXAM: DG HIP (WITH OR WITHOUT PELVIS) 2-3V LEFT COMPARISON:  None. FINDINGS: The cortical margins of the bony pelvis and left hip are intact. No fracture. Pubic symphysis and sacroiliac joints are congruent. Both femoral heads are well-seated in the respective acetabula. Bones are under mineralized. IMPRESSION: No pelvic or left hip fracture. Electronically Signed   By: Jeb Levering M.D.   On: 03/23/2018 19:18    Orson Eva, DO  [facility] Pager [phone]  If 7PM-7AM, please contact night-coverage [email or web address] Password [email or web address] 03/26/2018, 1:48 PM   LOS: 3 days

## 2018-03-26 NOTE — Plan of Care (Signed)
Progressing

## 2018-03-26 NOTE — Progress Notes (Signed)
Walked patient to the [facility] and then back to the bed. Patient tolerated well.

## 2018-03-27 ENCOUNTER — Inpatient Hospital Stay
Admission: RE | Admit: 2018-03-27 | Discharge: 2018-04-15 | Disposition: A | Discharge status: Home / Self Care | Payer: Medicare Other | Source: Ambulatory Visit | Attending: Internal Medicine | Admitting: Internal Medicine

## 2018-03-27 DIAGNOSIS — E785 Hyperlipidemia, unspecified: Secondary | ICD-10-CM | POA: Diagnosis not present

## 2018-03-27 DIAGNOSIS — I350 Nonrheumatic aortic (valve) stenosis: Secondary | ICD-10-CM | POA: Diagnosis not present

## 2018-03-27 DIAGNOSIS — S32592D Other specified fracture of left pubis, subsequent encounter for fracture with routine healing: Secondary | ICD-10-CM | POA: Diagnosis not present

## 2018-03-27 DIAGNOSIS — I2510 Atherosclerotic heart disease of native coronary artery without angina pectoris: Secondary | ICD-10-CM | POA: Diagnosis not present

## 2018-03-27 DIAGNOSIS — Z4789 Encounter for other orthopedic aftercare: Secondary | ICD-10-CM | POA: Diagnosis not present

## 2018-03-27 DIAGNOSIS — E039 Hypothyroidism, unspecified: Secondary | ICD-10-CM | POA: Diagnosis not present

## 2018-03-27 DIAGNOSIS — J9601 Acute respiratory failure with hypoxia: Secondary | ICD-10-CM | POA: Diagnosis not present

## 2018-03-27 DIAGNOSIS — M6281 Muscle weakness (generalized): Secondary | ICD-10-CM | POA: Diagnosis not present

## 2018-03-27 DIAGNOSIS — E038 Other specified hypothyroidism: Secondary | ICD-10-CM | POA: Diagnosis not present

## 2018-03-27 DIAGNOSIS — I10 Essential (primary) hypertension: Secondary | ICD-10-CM | POA: Diagnosis not present

## 2018-03-27 DIAGNOSIS — R262 Difficulty in walking, not elsewhere classified: Secondary | ICD-10-CM | POA: Diagnosis not present

## 2018-03-27 DIAGNOSIS — Z9181 History of falling: Secondary | ICD-10-CM | POA: Diagnosis not present

## 2018-03-27 DIAGNOSIS — D696 Thrombocytopenia, unspecified: Secondary | ICD-10-CM | POA: Diagnosis not present

## 2018-03-27 DIAGNOSIS — J84115 Respiratory bronchiolitis interstitial lung disease: Secondary | ICD-10-CM | POA: Diagnosis not present

## 2018-03-27 DIAGNOSIS — S32502D Unspecified fracture of left pubis, subsequent encounter for fracture with routine healing: Secondary | ICD-10-CM | POA: Diagnosis not present

## 2018-03-27 LAB — BASIC METABOLIC PANEL
ANION GAP: 7 (ref 5–15)
BUN: 20 mg/dL (ref 6–20)
CO2: 27 mmol/L (ref 22–32)
Calcium: 8.7 mg/dL — ABNORMAL LOW (ref 8.9–10.3)
Chloride: 105 mmol/L (ref 101–111)
Creatinine, Ser: 0.79 mg/dL (ref 0.44–1.00)
GLUCOSE: 111 mg/dL — AB (ref 65–99)
POTASSIUM: 4 mmol/L (ref 3.5–5.1)
Sodium: 139 mmol/L (ref 135–145)

## 2018-03-27 LAB — CBC
HEMATOCRIT: 40.1 % (ref 36.0–46.0)
Hemoglobin: 13.2 g/dL (ref 12.0–15.0)
MCH: 32.5 pg (ref 26.0–34.0)
MCHC: 32.9 g/dL (ref 30.0–36.0)
MCV: 98.8 fL (ref 78.0–100.0)
Platelets: 142 10*3/uL — ABNORMAL LOW (ref 150–400)
RBC: 4.06 MIL/uL (ref 3.87–5.11)
RDW: 12.1 % (ref 11.5–15.5)
WBC: 7.9 10*3/uL (ref 4.0–10.5)

## 2018-03-27 LAB — TSH: TSH: 4.224 u[IU]/mL (ref 0.350–4.500)

## 2018-03-27 LAB — VITAMIN B12: Vitamin B-12: 315 pg/mL (ref 180–914)

## 2018-03-27 LAB — T4, FREE: FREE T4: 1.14 ng/dL (ref 0.82–1.77)

## 2018-03-27 LAB — ANA: ANA: NEGATIVE

## 2018-03-27 MED ORDER — ENOXAPARIN SODIUM 40 MG/0.4ML ~~LOC~~ SOLN
40.0000 mg | SUBCUTANEOUS | Status: DC
Start: 2018-03-28 — End: 2018-03-27

## 2018-03-27 NOTE — Progress Notes (Signed)
Report called to Jocelyn Lamer, RN at the [facility]. IV removed, site WDL. Patient will be transported via wheelchair. Daughter at bedside.

## 2018-03-27 NOTE — Care Management Important Message (Signed)
Important Message  Patient Details  Name: Christine Juarez MRN: [id number] Date of Birth: [birth year]   Medicare Important Message Given:  Yes    [name] 03/27/2018, 11:18 AM

## 2018-03-27 NOTE — Clinical Social Work Placement (Signed)
   CLINICAL SOCIAL WORK PLACEMENT  NOTE  Date:  03/27/2018  Patient Details  Name: Christine Juarez MRN: [id number] Date of Birth: [birth year]  Clinical Social Work is seeking post-discharge placement for this patient at the [place] level of care (*CSW will initial, date and re-position this form in  chart as items are completed):  Yes   Patient/family provided with [place] Work Department's list of facilities offering this level of care within the geographic area requested by the patient (or if unable, by the patient's family).  Yes   Patient/family informed of their freedom to choose among providers that offer the needed level of care, that participate in Medicare, Medicaid or managed care program needed by the patient, have an available bed and are willing to accept the patient.  Yes   Patient/family informed of [facility]'s ownership interest in [facility] and [facility], as well as of the fact that they are under no obligation to receive care at these facilities.  PASRR submitted to EDS on 03/24/18     PASRR number received on 03/24/18     Existing PASRR number confirmed on       FL2 transmitted to all facilities in geographic area requested by pt/family on 03/24/18     FL2 transmitted to all facilities within larger geographic area on       Patient informed that his/her managed care company has contracts with or will negotiate with certain facilities, including the following:        Yes   Patient/family informed of bed offers received.  Patient chooses bed at [facility]     Physician recommends and patient chooses bed at      Patient to be transferred to [facility] on 03/27/18.  Patient to be transferred to facility by [facility] staff     Patient family notified on 03/27/18 of transfer.  Name of family member notified:  message left for son, Timmothy Sours.     PHYSICIAN       Additional Comment:     _______________________________________________ Ihor Gully, LCSW 03/27/2018, 12:43 PM

## 2018-03-28 ENCOUNTER — Encounter: Payer: Self-pay | Admitting: Internal Medicine

## 2018-03-28 ENCOUNTER — Non-Acute Institutional Stay (SKILLED_NURSING_FACILITY): Payer: Medicare Other | Admitting: Internal Medicine

## 2018-03-28 DIAGNOSIS — I10 Essential (primary) hypertension: Secondary | ICD-10-CM | POA: Diagnosis not present

## 2018-03-28 DIAGNOSIS — S32502D Unspecified fracture of left pubis, subsequent encounter for fracture with routine healing: Secondary | ICD-10-CM | POA: Diagnosis not present

## 2018-03-28 DIAGNOSIS — I2510 Atherosclerotic heart disease of native coronary artery without angina pectoris: Secondary | ICD-10-CM

## 2018-03-28 DIAGNOSIS — E785 Hyperlipidemia, unspecified: Secondary | ICD-10-CM | POA: Diagnosis not present

## 2018-03-28 DIAGNOSIS — E038 Other specified hypothyroidism: Secondary | ICD-10-CM | POA: Diagnosis not present

## 2018-03-28 DIAGNOSIS — J9601 Acute respiratory failure with hypoxia: Secondary | ICD-10-CM

## 2018-03-28 LAB — ALPHA-1-ANTITRYPSIN: A1 ANTITRYPSIN SER: 165 mg/dL (ref 90–200)

## 2018-03-28 NOTE — Progress Notes (Signed)
Provider: Veleta Miners  Location:   [place] Room Number: 144/P Place of Service:  SNF (31)  PCP: Celene Squibb, MD Patient Care Team: Celene Squibb, MD as PCP - General (Internal Medicine)  Extended Emergency Contact Information Primary Emergency Contact: Reagan,[name] Address: [address]          [place], [place] [address] [name] of [place] Phone: [phone] Work Phone: [phone] Mobile Phone: [phone] Relation: Daughter Secondary Emergency Contact: Richardson Landry Address: [address]          [place], [place] [address] [name] Phone: [phone] Relation: Daughter  Code Status: Full Code  Goals of Care: Advanced Directive information Advanced Directives 03/28/2018  Does Patient Have a Medical Advance Directive? Yes  Type of Advance Directive (No Data)  Does patient want to make changes to medical advance directive? No - Patient declined  Would patient like information on creating a medical advance directive? -      Chief Complaint  Patient presents with  . New Admit To SNF    Patient being seen for New Admission Visit    HPI: Patient is a 84 y.o. female seen today for admission to SNF for therapy after staying in the hospital from 06/06-06/10 For Left Pubo acetabular Non displaced fracture and Hypoxia.  Patient has h/o CAD S/P CABG, Hypertension, hyperlipidemia, hypothyroidism, aortic stenosis  Patient came to the emergency room after having a mechanical fall at home.  CT scan of the left hip revealed an acute nondisplaced fracture of left pubo-acetabular junction.  Per orthopedics she could be managed Non operatively. Patient was also found to have low pulse ox on room air.  Her CT scan of chest showed ? Potential Interstitial Lung Disease. And 10x14 mm ground-glass attenuation nodule in the right upper lobe near the apex Patient was treated with bronchodilators.  Patient did not have any complaints of  cough, chest pain, shortness of breath, fever or chills Patient was very independent before this fall.  She drives sometimes also.  Her son lives with her.  She was walking with a cane.  She does his have a history of recurrent falls. Past Medical History:  Diagnosis Date  . Aortic stenosis    mild by echo 08/2015  . Arthritis   . CAD (coronary artery disease), native coronary artery    s/p remote CABG  . HTN (hypertension)   . Hyperlipidemia   . Neuropathy   . Thyroid disease    Past Surgical History:  Procedure Laterality Date  . APPENDECTOMY    . ARTERIAL BYPASS SURGRY    . BACK SURGERY    . COLONOSCOPY    . LIPOMA EXCISION    . SKULL FRACTURE ELEVATION    . UPPER GASTROINTESTINAL ENDOSCOPY      reports that she has never smoked. She has never used smokeless tobacco. She reports that she does not drink alcohol or use drugs. Social History   Socioeconomic History  . Marital status: Widowed    Spouse name: Not on file  . Number of children: Not on file  . Years of education: 66  . Highest education level: Not on file  Occupational History  . Not on file  Social Needs  . Financial resource strain: Not on file  . Food insecurity:    Worry: Not on file    Inability: Not on file  . Transportation needs:    Medical: Not on file    Non-medical:  Not on file  Tobacco Use  . Smoking status: Never Smoker  . Smokeless tobacco: Never Used  Substance and Sexual Activity  . Alcohol use: No  . Drug use: Never  . Sexual activity: Not on file  Lifestyle  . Physical activity:    Days per week: Not on file    Minutes per session: Not on file  . Stress: Not on file  Relationships  . Social connections:    Talks on phone: Not on file    Gets together: Not on file    Attends religious service: Not on file    Active member of club or organization: Not on file    Attends meetings of clubs or organizations: Not on file    Relationship status: Not on file  . Intimate partner  violence:    Fear of current or ex partner: Not on file    Emotionally abused: Not on file    Physically abused: Not on file    Forced sexual activity: Not on file  Other Topics Concern  . Not on file  Social History Narrative  . Not on file    Functional Status Survey:    Family History  Problem Relation Age of Onset  . Colon cancer Sister   . Ovarian cancer Sister   . Alzheimer's disease Sister   . Colon cancer Sister   . Hypertension Daughter   . GER disease Daughter   . Anxiety disorder Daughter   . Hyperlipidemia Daughter   . Hypertension Son   . Arthritis Unknown   . Cancer Unknown     Health Maintenance  Topic Date Due  . TETANUS/TDAP  04/27/2018 (Originally 07/05/2016)  . PNA vac Low Risk Adult (1 of 2 - PCV13) 04/27/2018 (Originally 02/07/1987)  . INFLUENZA VACCINE  05/18/2018  . DEXA SCAN  Completed    Allergies  Allergen Reactions  . Codeine     UNKNOWN  . Dicyclomine Hcl     UNKNOWN  . Erythromycin     UNKNOWN  . Lodine [Etodolac]   . Meclizine Hcl     amnesia  . Niacin     UNKNOWN  . Statins     Muscle pain    Outpatient Encounter Medications as of 03/28/2018  Medication Sig  . aspirin 81 MG tablet Take 81 mg by mouth daily.    . Coenzyme Q10 (COQ10) 100 MG CAPS Take 1 capsule by mouth daily.   Marland Kitchen ezetimibe (ZETIA) 10 MG tablet TAKE 1 TABLET BY MOUTH ONCE DAILY FOR CHOLESTEROL.  Marland Kitchen HYDROcodone-acetaminophen (NORCO/VICODIN) 5-325 MG tablet Take 1 tablet by mouth every 4 (four) hours as needed for moderate pain.  . isosorbide mononitrate (IMDUR) 60 MG 24 hr tablet TAKE 1 TABLET BY MOUTH ONCE DAILY.  . Multiple Vitamins-Minerals (OCUVITE PO) Take 1 tablet by mouth daily.  . NORVASC 5 MG tablet TAKE (1) TABLET BY MOUTH ONCE DAILY.  Marland Kitchen omeprazole (PRILOSEC) 20 MG capsule Take 20 mg by mouth daily.  Marland Kitchen SYNTHROID 88 MCG tablet Take 88 mcg by mouth daily before breakfast.   No facility-administered encounter medications on file as of 03/28/2018.       Review of Systems  Review of Systems  Constitutional: Negative for activity change, appetite change, chills, diaphoresis, fatigue and fever.  HENT: Negative for mouth sores, postnasal drip, rhinorrhea, sinus pain and sore throat.   Respiratory: Negative for apnea, cough, chest tightness, shortness of breath and wheezing.   Cardiovascular: Negative for chest pain, palpitations and  leg swelling.  Gastrointestinal: Negative for abdominal distention, abdominal pain, constipation, diarrhea, nausea and vomiting.  Genitourinary: Negative for dysuria and frequency.  Musculoskeletal: Negative for arthralgias, joint swelling and myalgias.  Skin: Negative for rash.  Neurological: Negative for dizziness, syncope, weakness, light-headedness and numbness.  Psychiatric/Behavioral: Negative for behavioral problems, confusion and sleep disturbance.     Vitals:   03/28/18 1041  BP: (!) 160/74  Pulse: 80  Resp: 20  Temp: 98.3 F (36.8 C)  TempSrc: Oral   There is no height or weight on file to calculate BMI. Physical Exam  Constitutional: She is oriented to person, place, and time. She appears well-developed and well-nourished.  HENT:  Head: Normocephalic.  Eyes: Pupils are equal, round, and reactive to light.  Neck: Neck supple.  Cardiovascular: Normal rate and regular rhythm.  No murmur heard. Pulmonary/Chest: Effort normal and breath sounds normal. No stridor. No respiratory distress. She has no wheezes.  Abdominal: Soft. Bowel sounds are normal. She exhibits no distension. There is no tenderness. There is no guarding.  Musculoskeletal: She exhibits no edema.  Neurological: She is alert and oriented to person, place, and time.  No Focal Deficits  Skin: Skin is warm and dry.  Psychiatric: She has a normal mood and affect. Her behavior is normal. Thought content normal.    Labs reviewed: Basic Metabolic Panel: Recent Labs    03/23/18 2232 03/24/18 0354 03/25/18 0610  03/27/18 0635  NA 138 140 138 139  K 3.8 4.4 4.0 4.0  CL 105 108 106 105  CO2 24 25 27 27   GLUCOSE 115* 132* 104* 111*  BUN 19 17 14 20   CREATININE 0.83 0.76 0.76 0.79  CALCIUM 9.3 8.4* 8.5* 8.7*  MG 1.9  --  1.8  --   PHOS 3.1  --   --   --    Liver Function Tests: Recent Labs    03/23/18 2232 03/24/18 0354  AST 35 28  ALT 28 25  ALKPHOS 111 89  BILITOT 1.2 0.9  PROT 7.0 5.5*  ALBUMIN 4.1 3.2*   No results for input(s): LIPASE, AMYLASE in the last 8760 hours. No results for input(s): AMMONIA in the last 8760 hours. CBC: Recent Labs    03/23/18 2232 03/24/18 0354 03/25/18 0610 03/27/18 0635  WBC 15.8* 12.3* 9.5 7.9  NEUTROABS 13.7*  --   --   --   HGB 15.6* 13.3 13.4 13.2  HCT 45.1 40.3 40.1 40.1  MCV 97.4 98.3 99.5 98.8  PLT 174 161 134* 142*   Cardiac Enzymes: No results for input(s): CKTOTAL, CKMB, CKMBINDEX, TROPONINI in the last 8760 hours. BNP: Invalid input(s): POCBNP No results found for: HGBA1C Lab Results  Component Value Date   TSH 4.224 03/27/2018   Lab Results  Component Value Date   VITAMINB12 315 03/27/2018   No results found for: FOLATE No results found for: IRON, TIBC, FERRITIN  Imaging and Procedures obtained prior to SNF admission: No results found.  Assessment/Plan  Essential hypertension Patient's blood pressure mildly elevated today We will continue on amlodipine and monitor  CAD Stable  Follows with cardiology On aspirin, Imdur,and Zetia Acute respiratory failure with hypoxia  Patient is asymptomatic right now. Her pulse ox is stable on room air She will need follow-up of her CT scan as outpatient. She will also need follow-up with pulmonology as outpatient  Closed nondisplaced fracture of left pubis  There are Ortho Non Surgical Healing. Patient has already started therapy Her pain is controlled  Dyslipidemia On Zetia follows with her PCP  mild thrombocytopenia B12 normal Will Repeat CBC Hypothyroidism TSH  Normal in hospital Continue same dose of Synthroid  Patient states she does not take omeprazole anymore.  Will discontinue Disposition Patient lives with her son who is there 24 hours she plans to go home  Family/ staff Communication:   Labs/tests ordered: Total time spent in this patient care encounter was 45_ minutes; greater than 50% of the visit spent counseling patient, reviewing records , Labs and coordinating care for problems addressed at this encounter.

## 2018-03-31 ENCOUNTER — Other Ambulatory Visit: Payer: Self-pay

## 2018-03-31 MED ORDER — HYDROCODONE-ACETAMINOPHEN 5-325 MG PO TABS: 1.0000 | ORAL_TABLET | ORAL | 0 refills | Status: DC | PRN

## 2018-03-31 NOTE — Telephone Encounter (Signed)
RX Fax for [facility]@ [phone]  

## 2018-04-04 ENCOUNTER — Encounter (HOSPITAL_COMMUNITY)
Admission: RE | Admit: 2018-04-04 | Discharge: 2018-04-04 | Disposition: A | Discharge status: Home / Self Care | Payer: Medicare Other | Source: Skilled Nursing Facility | Attending: Internal Medicine | Admitting: Internal Medicine

## 2018-04-04 DIAGNOSIS — S32592D Other specified fracture of left pubis, subsequent encounter for fracture with routine healing: Secondary | ICD-10-CM | POA: Insufficient documentation

## 2018-04-04 DIAGNOSIS — Z4789 Encounter for other orthopedic aftercare: Secondary | ICD-10-CM | POA: Insufficient documentation

## 2018-04-04 DIAGNOSIS — I10 Essential (primary) hypertension: Secondary | ICD-10-CM | POA: Insufficient documentation

## 2018-04-04 DIAGNOSIS — Z9181 History of falling: Secondary | ICD-10-CM | POA: Insufficient documentation

## 2018-04-04 LAB — COMPREHENSIVE METABOLIC PANEL
ALT: 15 U/L (ref 14–54)
AST: 15 U/L (ref 15–41)
Albumin: 3.5 g/dL (ref 3.5–5.0)
Alkaline Phosphatase: 128 U/L — ABNORMAL HIGH (ref 38–126)
Anion gap: 10 (ref 5–15)
BUN: 15 mg/dL (ref 6–20)
CO2: 24 mmol/L (ref 22–32)
Calcium: 9 mg/dL (ref 8.9–10.3)
Chloride: 106 mmol/L (ref 101–111)
Creatinine, Ser: 0.64 mg/dL (ref 0.44–1.00)
GFR calc Af Amer: >60 mL/min (ref >60)
GFR calc non Af Amer: >60 mL/min (ref >60)
Glucose, Bld: 146 mg/dL — ABNORMAL HIGH (ref 65–99)
Potassium: 3.7 mmol/L (ref 3.5–5.1)
Sodium: 140 mmol/L (ref 135–145)
Total Bilirubin: 0.6 mg/dL (ref 0.3–1.2)
Total Protein: 6.5 g/dL (ref 6.5–8.1)

## 2018-04-04 LAB — CBC
HCT: 41.8 % (ref 36.0–46.0)
HEMOGLOBIN: 13.8 g/dL (ref 12.0–15.0)
MCH: 32.9 pg (ref 26.0–34.0)
MCHC: 33 g/dL (ref 30.0–36.0)
MCV: 99.8 fL (ref 78.0–100.0)
PLATELETS: 317 10*3/uL (ref 150–400)
RBC: 4.19 MIL/uL (ref 3.87–5.11)
RDW: 12.2 % (ref 11.5–15.5)
WBC: 7.9 10*3/uL (ref 4.0–10.5)

## 2018-04-07 ENCOUNTER — Other Ambulatory Visit: Payer: Self-pay

## 2018-04-07 MED ORDER — HYDROCODONE-ACETAMINOPHEN 5-325 MG PO TABS: 1.0000 | ORAL_TABLET | ORAL | 0 refills | Status: DC | PRN

## 2018-04-07 NOTE — Telephone Encounter (Signed)
RX Fax for [facility]@ [phone]  

## 2018-04-12 ENCOUNTER — Encounter: Payer: Self-pay | Admitting: Internal Medicine

## 2018-04-12 ENCOUNTER — Non-Acute Institutional Stay (SKILLED_NURSING_FACILITY): Payer: Medicare Other | Admitting: Internal Medicine

## 2018-04-12 DIAGNOSIS — E038 Other specified hypothyroidism: Secondary | ICD-10-CM

## 2018-04-12 DIAGNOSIS — I2510 Atherosclerotic heart disease of native coronary artery without angina pectoris: Secondary | ICD-10-CM

## 2018-04-12 DIAGNOSIS — S32502D Unspecified fracture of left pubis, subsequent encounter for fracture with routine healing: Secondary | ICD-10-CM | POA: Diagnosis not present

## 2018-04-12 DIAGNOSIS — J9601 Acute respiratory failure with hypoxia: Secondary | ICD-10-CM | POA: Diagnosis not present

## 2018-04-12 DIAGNOSIS — I10 Essential (primary) hypertension: Secondary | ICD-10-CM

## 2018-04-12 NOTE — Progress Notes (Signed)
Location:   [place] Room Number: 144/P Place of Service:  SNF [phone])  Provider: Granville Lewis  PCP: Celene Squibb, MD Patient Care Team: Celene Squibb, MD as PCP - General (Internal Medicine)  Extended Emergency Contact Information Primary Emergency Contact: Reagan,[name] Address: [address]          [place], [place] [address] [name] Phone: [phone] Work Phone: [phone] Mobile Phone: [phone] Relation: Daughter Secondary Emergency Contact: Richardson Landry Address: [address]          [place], [place] [address] [name] Phone: [phone] Relation: Daughter  Code Status: Full Code Goals of care:  Advanced Directive information Advanced Directives 04/12/2018  Does Patient Have a Medical Advance Directive? Yes  Type of Advance Directive (No Data)  Does patient want to make changes to medical advance directive? No - Patient declined  Would patient like information on creating a medical advance directive? -     Allergies  Allergen Reactions  . Codeine     UNKNOWN  . Dicyclomine Hcl     UNKNOWN  . Erythromycin     UNKNOWN  . Lodine [Etodolac]   . Meclizine Hcl     amnesia  . Niacin     UNKNOWN  . Statins     Muscle pain    Chief Complaint  Patient presents with  . Discharge Note    Discharge Visit     HPI:  84 y.o. female seen today for discharge from facility later this week.  Patient was here for short-term rehab after a short hospitalization for a left acute nondisplaced fracture of the left  pubo-acetabular junction.  Per orthopedics she could be managed Non operatively.  She was also found to have hypoxia on room air- CT scan showed a potential interstitial lung disease process- and a 10 x 14 mm groundglass nodule in the right upper lobe near the apex.  She was treated with bronchodilators- was relatively asymptomatic.  Nodule will need follow-up as an outpatient.  Regards to the  nondisplaced oblique fracture she is done quite well is walking with a walker and doing quite well with this although she would benefit from therapy.  Currently she has no complaints and is looking forward to going home she is very independent--her son does live with her.  Pain appears to be controlled she occasionally will use her hydrocodone but this is not frequent.  She is also no longer using oxygen.          Past Medical History:  Diagnosis Date  . Aortic stenosis    mild by echo 08/2015  . Arthritis   . CAD (coronary artery disease), native coronary artery    s/p remote CABG  . HTN (hypertension)   . Hyperlipidemia   . Neuropathy   . Thyroid disease     Past Surgical History:  Procedure Laterality Date  . APPENDECTOMY    . ARTERIAL BYPASS SURGRY    . BACK SURGERY    . COLONOSCOPY    . LIPOMA EXCISION    . SKULL FRACTURE ELEVATION    . UPPER GASTROINTESTINAL ENDOSCOPY        reports that she has never smoked. She has never used smokeless tobacco. She reports that she does not drink alcohol or use drugs. Social History   Socioeconomic History  . Marital status: Widowed    Spouse name: Not on file  . Number of children: Not on file  .  Years of education: 6  . Highest education level: Not on file  Occupational History  . Not on file  Social Needs  . Financial resource strain: Not on file  . Food insecurity:    Worry: Not on file    Inability: Not on file  . Transportation needs:    Medical: Not on file    Non-medical: Not on file  Tobacco Use  . Smoking status: Never Smoker  . Smokeless tobacco: Never Used  Substance and Sexual Activity  . Alcohol use: No  . Drug use: Never  . Sexual activity: Not on file  Lifestyle  . Physical activity:    Days per week: Not on file    Minutes per session: Not on file  . Stress: Not on file  Relationships  . Social connections:    Talks on phone: Not on file    Gets together: Not on file    Attends  religious service: Not on file    Active member of club or organization: Not on file    Attends meetings of clubs or organizations: Not on file    Relationship status: Not on file  . Intimate partner violence:    Fear of current or ex partner: Not on file    Emotionally abused: Not on file    Physically abused: Not on file    Forced sexual activity: Not on file  Other Topics Concern  . Not on file  Social History Narrative  . Not on file   Functional Status Survey:    Allergies  Allergen Reactions  . Codeine     UNKNOWN  . Dicyclomine Hcl     UNKNOWN  . Erythromycin     UNKNOWN  . Lodine [Etodolac]   . Meclizine Hcl     amnesia  . Niacin     UNKNOWN  . Statins     Muscle pain    Pertinent  Health Maintenance Due  Topic Date Due  . PNA vac Low Risk Adult (1 of 2 - PCV13) 04/27/2018 (Originally 02/07/1985)  . INFLUENZA VACCINE  05/18/2018  . DEXA SCAN  Completed    Medications: Outpatient Encounter Medications as of 04/12/2018  Medication Sig  . aspirin 81 MG tablet Take 81 mg by mouth daily.    . Coenzyme Q10 (COQ10) 100 MG CAPS Take 1 capsule by mouth daily.   Marland Kitchen ezetimibe (ZETIA) 10 MG tablet TAKE 1 TABLET BY MOUTH ONCE DAILY FOR CHOLESTEROL.  Marland Kitchen HYDROcodone-acetaminophen (NORCO/VICODIN) 5-325 MG tablet Take 1 tablet by mouth every 4 (four) hours as needed for moderate pain.  . isosorbide mononitrate (IMDUR) 60 MG 24 hr tablet TAKE 1 TABLET BY MOUTH ONCE DAILY.  . Multiple Vitamins-Minerals (OCUVITE PO) Take 1 tablet by mouth daily.  . NORVASC 5 MG tablet TAKE (1) TABLET BY MOUTH ONCE DAILY.  Marland Kitchen omeprazole (PRILOSEC) 20 MG capsule Take 20 mg by mouth daily.  Marland Kitchen SYNTHROID 88 MCG tablet Take 88 mcg by mouth daily before breakfast.   No facility-administered encounter medications on file as of 04/12/2018.      Review of Systems   In general she is not complaining of any fever or chills.  Skin does not complain of rashes or itching.  Head ears eyes nose mouth and  throat does not complain of visual changes or sore throat.  Respiratory is not complaining of shortness of breath or cough again does have the pulmonary nodule that will need follow-up.  Cardiac is not complaining of  any chest pain or palpitations or significant edema.  GI is not complaining of abdominal pain nausea vomiting diarrhea or constipation.  GU does not complain of dysuria.  Musculoskeletal says her pain is relatively well controlled now is weightbearing as tolerated and using a walker-she takes her hydrocodone occasionally but this is fairly sparingly.  Neurologic does not complain of dizziness headache or numbness.  Psych does not complain of depression or anxiety.    Vitals:   04/12/18 1309  BP: 114/62  Pulse: 70  Resp: 20  Temp: 97.9 F (36.6 C)  TempSrc: Oral  Per review blood pressures there is some variability I got 158/92 manually- but this is not consistent and appear to be may be a little anxious.  I see readings ranging from 117/68- 143/71  Physical Exam   In general this is a very pleasant elderly female who looks younger than her stated age.  Her skin is warm and dry.  Eyes visual acuity appears to be intact sclera and conjunctive are clear she has prescription lenses.  Oropharynx is clear mucous membranes moist.  Chest is clear to auscultation there is no labored breathing.  Heart is regular rate and rhythm without murmur gallop or rub she does not really have significant lower extremity edema.  Abdomen is soft nontender with positive bowel sounds.  Musculoskeletal moves all extremities x4 is able to stand without assistance and ambulate with walker stilllittle  weak though  Neurologic is grossly intact her speech is clear no lateralizing findings  Psych she is alert and oriented pleasant and appropriate    Labs reviewed: Basic Metabolic Panel: Recent Labs    03/23/18 2232  03/25/18 0610 03/27/18 0635 04/04/18 0700  NA 138   < >  138 139 140  K 3.8   < > 4.0 4.0 3.7  CL 105   < > 106 105 106  CO2 24   < > 27 27 24   GLUCOSE 115*   < > 104* 111* 146*  BUN 19   < > 14 20 15   CREATININE 0.83   < > 0.76 0.79 0.64  CALCIUM 9.3   < > 8.5* 8.7* 9.0  MG 1.9  --  1.8  --   --   PHOS 3.1  --   --   --   --    < > = values in this interval not displayed.   Liver Function Tests: Recent Labs    03/23/18 2232 03/24/18 0354 04/04/18 0700  AST 35 28 15  ALT 28 25 15   ALKPHOS 111 89 128*  BILITOT 1.2 0.9 0.6  PROT 7.0 5.5* 6.5  ALBUMIN 4.1 3.2* 3.5   No results for input(s): LIPASE, AMYLASE in the last 8760 hours. No results for input(s): AMMONIA in the last 8760 hours. CBC: Recent Labs    03/23/18 2232  03/25/18 0610 03/27/18 0635 04/04/18 0700  WBC 15.8*   < > 9.5 7.9 7.9  NEUTROABS 13.7*  --   --   --   --   HGB 15.6*   < > 13.4 13.2 13.8  HCT 45.1   < > 40.1 40.1 41.8  MCV 97.4   < > 99.5 98.8 99.8  PLT 174   < > 134* 142* 317   < > = values in this interval not displayed.   Cardiac Enzymes: No results for input(s): CKTOTAL, CKMB, CKMBINDEX, TROPONINI in the last 8760 hours. BNP: Invalid input(s): POCBNP CBG: No results for input(s): GLUCAP in  the last 8760 hours.  Procedures and Imaging Studies During Stay: Dg Chest 2 View  Result Date: 03/23/2018 CLINICAL DATA:  Fall today. EXAM: CHEST - 2 VIEW COMPARISON:  Chest radiograph 10/29/2010 FINDINGS: Post median sternotomy and CABG. Unchanged cardiomegaly. Atherosclerosis of the thoracic aorta. Lower lung volumes from prior exam leading to bronchovascular crowding. No pneumothorax or focal airspace disease. No pulmonary edema or pleural fluid. No visualized rib fracture or acute osseous abnormality. Bones are under mineralized. IMPRESSION: 1. No acute findings. 2. Post CABG with stable cardiomegaly. Electronically Signed   By: Jeb Levering M.D.   On: 03/23/2018 19:20   Ct Chest Wo Contrast  Result Date: 03/24/2018 CLINICAL DATA:  84 year old female  with abnormal lung sounds. Recent pubic ramus fracture. EXAM: CT CHEST WITHOUT CONTRAST TECHNIQUE: Multidetector CT imaging of the chest was performed following the standard protocol without IV contrast. COMPARISON:  No priors. FINDINGS: Cardiovascular: Heart size is normal. There is no significant pericardial fluid, thickening or pericardial calcification. There is aortic atherosclerosis, as well as atherosclerosis of the great vessels of the mediastinum and the coronary arteries, including calcified atherosclerotic plaque in the left main, left anterior descending, left circumflex and right coronary arteries. Status post median sternotomy for CABG including LIMA to the LAD. Severe calcifications of the aortic valve. Mediastinum/Nodes: No pathologically enlarged mediastinal or hilar lymph nodes. Please note that accurate exclusion of hilar adenopathy is limited on noncontrast CT scans. Small hiatal hernia. No axillary lymphadenopathy. Lungs/Pleura: 10 x 14 mm ground-glass attenuation nodule in the right upper lobe near the apex (axial image 31 of series 4). No other suspicious appearing pulmonary nodules or masses are noted. No acute consolidative airspace disease. No pleural effusions. Widespread septal thickening throughout the periphery of the lung bases. Mild cylindrical bronchiectasis and peripheral bronchiolectasis also noted in the lung bases. Upper Abdomen: Aortic atherosclerosis. Calcified granuloma in the left lobe of the liver. Musculoskeletal: Nondisplaced fracture of the anterolateral aspect of the left sixth rib (axial image 94 of series 4). Median sternotomy wires. There are no aggressive appearing lytic or blastic lesions noted in the visualized portions of the skeleton. IMPRESSION: 1. The appearance of the lungs suggests potential interstitial lung disease. Outpatient referral to Pulmonology for further evaluation is recommended. 2. 10 x 14 mm ground-glass attenuation nodule in the right upper lobe  near the apex. No central solid component at this time to suggest an invasive malignancy. However, attention on any future follow-up chest CTs is suggested. 3. Aortic atherosclerosis, in addition to left main and 3 vessel coronary artery disease. Status post median sternotomy for CABG including LIMA to the LAD. 4. Small hiatal hernia. Aortic Atherosclerosis (ICD10-I70.0). Electronically Signed   By: Vinnie Langton M.D.   On: 03/24/2018 11:36   Ct Hip Left Wo Contrast  Result Date: 03/23/2018 CLINICAL DATA:  Left forearm pain after fall. EXAM: CT OF THE LEFT HIP WITHOUT CONTRAST TECHNIQUE: Multidetector CT imaging of the left hip was performed according to the standard protocol. Multiplanar CT image reconstructions were also generated. COMPARISON:  Left hip x-rays from same day. FINDINGS: Bones/Joint/Cartilage Acute, nondisplaced fracture of the left puboacetabular junction. Slightly angulated deformity of the left inferior pubic ramus without discrete fracture line may reflect an old healed fracture. No femur fracture. No dislocation. Diffuse osteopenia. No hip joint effusion. The hip joint space is well preserved. Ligaments Suboptimally assessed by CT. Muscles and Tendons Mild atrophy of the gluteus minimus muscle. The visualized left gluteal, iliopsoas, and hamstring tendons are  unremarkable. Soft tissues Minimal subcutaneous fat stranding over the lateral hip. No soft tissue mass or fluid collection. Extensive sigmoid diverticulosis. Small fat containing left inguinal hernia. IMPRESSION: 1. Acute, nondisplaced fracture of the left puboacetabular junction. Electronically Signed   By: Titus Dubin M.D.   On: 03/23/2018 22:06   Dg Hip Unilat W Or Wo Pelvis 2-3 Views Left  Result Date: 03/23/2018 CLINICAL DATA:  Fall today with left hip pain. EXAM: DG HIP (WITH OR WITHOUT PELVIS) 2-3V LEFT COMPARISON:  None. FINDINGS: The cortical margins of the bony pelvis and left hip are intact. No fracture. Pubic  symphysis and sacroiliac joints are congruent. Both femoral heads are well-seated in the respective acetabula. Bones are under mineralized. IMPRESSION: No pelvic or left hip fracture. Electronically Signed   By: Jeb Levering M.D.   On: 03/23/2018 19:18    Assessment/Plan:    #1- history of closed nondisplaced fracture of left pubis-she appears to have done well with therapy thought to be a nonsurgical issue per orthopedics-she will need follow-up by orthopedics-and continued PT and OT but appears to be doing well pain appears to be controlled will occasionally take her hydrocodone but this is not frequent.  2.  History of acute respiratory failure with hypoxia- she is not symptomatic-does not complain of any chest pain or shortness of breath- no hypoxia has been noted- she will need follow-up CT scan as an outpatient for the pulmonary nodule-as well as I suspect pulmonology follow-up.  4.-  Hypertension- at times she will have occasional elevations but this does not appear to be consistent at this point will defer to primary care provider currently on Norvasc.  5.-Coronary artery disease this is been essentially asymptomatic she is followed by cardiology continues on aspirin Imdur and Zetia.  6.-  History of hyperlipidemia again she is on Zetia will warrant follow-up with primary care provider.  7.  History of mild thrombocytopenia this appears resolved platelet count was 317,000 on lab done on June 18 follow-up by primary care provider as needed.  8.  History of hypothyroidism TSH has been within normal limits most recently 2.224 on lab done in hospital she is on Synthroid.  Again she will need follow-up of her pulmonary nodule- as noted above-but clinically appears to be doing well she will be with her son-and will need continued PT and OT-.  [id number] note greater than 30 minutes spent on this discharge summary- greater than 50% of time spent coordinating a plan of care for numerous  diagnoses

## 2018-04-13 ENCOUNTER — Encounter: Payer: Self-pay | Admitting: Internal Medicine

## 2018-04-24 DIAGNOSIS — E785 Hyperlipidemia, unspecified: Secondary | ICD-10-CM | POA: Diagnosis not present

## 2018-04-24 DIAGNOSIS — I2510 Atherosclerotic heart disease of native coronary artery without angina pectoris: Secondary | ICD-10-CM | POA: Diagnosis not present

## 2018-04-24 DIAGNOSIS — I700 Atherosclerosis of aorta: Secondary | ICD-10-CM | POA: Diagnosis not present

## 2018-04-24 DIAGNOSIS — J84115 Respiratory bronchiolitis interstitial lung disease: Secondary | ICD-10-CM | POA: Diagnosis not present

## 2018-04-24 DIAGNOSIS — I10 Essential (primary) hypertension: Secondary | ICD-10-CM | POA: Diagnosis not present

## 2018-04-24 DIAGNOSIS — E039 Hypothyroidism, unspecified: Secondary | ICD-10-CM | POA: Diagnosis not present

## 2018-04-24 DIAGNOSIS — M1991 Primary osteoarthritis, unspecified site: Secondary | ICD-10-CM | POA: Diagnosis not present

## 2018-04-24 DIAGNOSIS — Z951 Presence of aortocoronary bypass graft: Secondary | ICD-10-CM | POA: Diagnosis not present

## 2018-04-24 DIAGNOSIS — G629 Polyneuropathy, unspecified: Secondary | ICD-10-CM | POA: Diagnosis not present

## 2018-04-24 DIAGNOSIS — S32402D Unspecified fracture of left acetabulum, subsequent encounter for fracture with routine healing: Secondary | ICD-10-CM | POA: Diagnosis not present

## 2018-04-24 DIAGNOSIS — Z7982 Long term (current) use of aspirin: Secondary | ICD-10-CM | POA: Diagnosis not present

## 2018-04-24 DIAGNOSIS — I350 Nonrheumatic aortic (valve) stenosis: Secondary | ICD-10-CM | POA: Diagnosis not present

## 2018-04-26 DIAGNOSIS — Z6824 Body mass index (BMI) 24.0-24.9, adult: Secondary | ICD-10-CM | POA: Diagnosis not present

## 2018-04-26 DIAGNOSIS — S72045D Nondisplaced fracture of base of neck of left femur, subsequent encounter for closed fracture with routine healing: Secondary | ICD-10-CM | POA: Diagnosis not present

## 2018-04-26 DIAGNOSIS — R918 Other nonspecific abnormal finding of lung field: Secondary | ICD-10-CM | POA: Diagnosis not present

## 2018-04-28 DIAGNOSIS — S32402D Unspecified fracture of left acetabulum, subsequent encounter for fracture with routine healing: Secondary | ICD-10-CM | POA: Diagnosis not present

## 2018-04-28 DIAGNOSIS — I10 Essential (primary) hypertension: Secondary | ICD-10-CM | POA: Diagnosis not present

## 2018-04-28 DIAGNOSIS — J84115 Respiratory bronchiolitis interstitial lung disease: Secondary | ICD-10-CM | POA: Diagnosis not present

## 2018-04-28 DIAGNOSIS — I350 Nonrheumatic aortic (valve) stenosis: Secondary | ICD-10-CM | POA: Diagnosis not present

## 2018-04-28 DIAGNOSIS — I2510 Atherosclerotic heart disease of native coronary artery without angina pectoris: Secondary | ICD-10-CM | POA: Diagnosis not present

## 2018-04-28 DIAGNOSIS — G629 Polyneuropathy, unspecified: Secondary | ICD-10-CM | POA: Diagnosis not present

## 2018-05-03 DIAGNOSIS — G629 Polyneuropathy, unspecified: Secondary | ICD-10-CM | POA: Diagnosis not present

## 2018-05-03 DIAGNOSIS — I10 Essential (primary) hypertension: Secondary | ICD-10-CM | POA: Diagnosis not present

## 2018-05-03 DIAGNOSIS — I2510 Atherosclerotic heart disease of native coronary artery without angina pectoris: Secondary | ICD-10-CM | POA: Diagnosis not present

## 2018-05-03 DIAGNOSIS — S32402D Unspecified fracture of left acetabulum, subsequent encounter for fracture with routine healing: Secondary | ICD-10-CM | POA: Diagnosis not present

## 2018-05-03 DIAGNOSIS — J84115 Respiratory bronchiolitis interstitial lung disease: Secondary | ICD-10-CM | POA: Diagnosis not present

## 2018-05-03 DIAGNOSIS — I350 Nonrheumatic aortic (valve) stenosis: Secondary | ICD-10-CM | POA: Diagnosis not present

## 2018-05-05 DIAGNOSIS — I10 Essential (primary) hypertension: Secondary | ICD-10-CM | POA: Diagnosis not present

## 2018-05-05 DIAGNOSIS — I350 Nonrheumatic aortic (valve) stenosis: Secondary | ICD-10-CM | POA: Diagnosis not present

## 2018-05-05 DIAGNOSIS — S32402D Unspecified fracture of left acetabulum, subsequent encounter for fracture with routine healing: Secondary | ICD-10-CM | POA: Diagnosis not present

## 2018-05-05 DIAGNOSIS — I2510 Atherosclerotic heart disease of native coronary artery without angina pectoris: Secondary | ICD-10-CM | POA: Diagnosis not present

## 2018-05-05 DIAGNOSIS — J84115 Respiratory bronchiolitis interstitial lung disease: Secondary | ICD-10-CM | POA: Diagnosis not present

## 2018-05-05 DIAGNOSIS — G629 Polyneuropathy, unspecified: Secondary | ICD-10-CM | POA: Diagnosis not present

## 2018-05-09 DIAGNOSIS — S32402D Unspecified fracture of left acetabulum, subsequent encounter for fracture with routine healing: Secondary | ICD-10-CM | POA: Diagnosis not present

## 2018-05-09 DIAGNOSIS — I350 Nonrheumatic aortic (valve) stenosis: Secondary | ICD-10-CM | POA: Diagnosis not present

## 2018-05-09 DIAGNOSIS — G629 Polyneuropathy, unspecified: Secondary | ICD-10-CM | POA: Diagnosis not present

## 2018-05-09 DIAGNOSIS — I10 Essential (primary) hypertension: Secondary | ICD-10-CM | POA: Diagnosis not present

## 2018-05-09 DIAGNOSIS — J84115 Respiratory bronchiolitis interstitial lung disease: Secondary | ICD-10-CM | POA: Diagnosis not present

## 2018-05-09 DIAGNOSIS — I2510 Atherosclerotic heart disease of native coronary artery without angina pectoris: Secondary | ICD-10-CM | POA: Diagnosis not present

## 2018-05-12 DIAGNOSIS — I350 Nonrheumatic aortic (valve) stenosis: Secondary | ICD-10-CM | POA: Diagnosis not present

## 2018-05-12 DIAGNOSIS — S32402D Unspecified fracture of left acetabulum, subsequent encounter for fracture with routine healing: Secondary | ICD-10-CM | POA: Diagnosis not present

## 2018-05-12 DIAGNOSIS — J84115 Respiratory bronchiolitis interstitial lung disease: Secondary | ICD-10-CM | POA: Diagnosis not present

## 2018-05-12 DIAGNOSIS — G629 Polyneuropathy, unspecified: Secondary | ICD-10-CM | POA: Diagnosis not present

## 2018-05-12 DIAGNOSIS — I10 Essential (primary) hypertension: Secondary | ICD-10-CM | POA: Diagnosis not present

## 2018-05-12 DIAGNOSIS — I2510 Atherosclerotic heart disease of native coronary artery without angina pectoris: Secondary | ICD-10-CM | POA: Diagnosis not present

## 2018-05-15 DIAGNOSIS — I10 Essential (primary) hypertension: Secondary | ICD-10-CM | POA: Diagnosis not present

## 2018-05-15 DIAGNOSIS — J84115 Respiratory bronchiolitis interstitial lung disease: Secondary | ICD-10-CM | POA: Diagnosis not present

## 2018-05-15 DIAGNOSIS — I2510 Atherosclerotic heart disease of native coronary artery without angina pectoris: Secondary | ICD-10-CM | POA: Diagnosis not present

## 2018-05-15 DIAGNOSIS — I350 Nonrheumatic aortic (valve) stenosis: Secondary | ICD-10-CM | POA: Diagnosis not present

## 2018-05-15 DIAGNOSIS — G629 Polyneuropathy, unspecified: Secondary | ICD-10-CM | POA: Diagnosis not present

## 2018-05-15 DIAGNOSIS — S32402D Unspecified fracture of left acetabulum, subsequent encounter for fracture with routine healing: Secondary | ICD-10-CM | POA: Diagnosis not present

## 2018-05-19 DIAGNOSIS — I350 Nonrheumatic aortic (valve) stenosis: Secondary | ICD-10-CM | POA: Diagnosis not present

## 2018-05-19 DIAGNOSIS — S32402D Unspecified fracture of left acetabulum, subsequent encounter for fracture with routine healing: Secondary | ICD-10-CM | POA: Diagnosis not present

## 2018-05-19 DIAGNOSIS — I10 Essential (primary) hypertension: Secondary | ICD-10-CM | POA: Diagnosis not present

## 2018-05-19 DIAGNOSIS — G629 Polyneuropathy, unspecified: Secondary | ICD-10-CM | POA: Diagnosis not present

## 2018-05-19 DIAGNOSIS — J84115 Respiratory bronchiolitis interstitial lung disease: Secondary | ICD-10-CM | POA: Diagnosis not present

## 2018-05-19 DIAGNOSIS — I2510 Atherosclerotic heart disease of native coronary artery without angina pectoris: Secondary | ICD-10-CM | POA: Diagnosis not present

## 2018-07-05 ENCOUNTER — Telehealth: Payer: Self-pay | Admitting: Cardiology

## 2018-07-05 NOTE — Telephone Encounter (Signed)
New Message:    Does pt need lab work before her annual appt on 09-11-18?

## 2018-07-05 NOTE — Telephone Encounter (Signed)
Spoke with patient's daughter she said she would bring the fasting lab results from her office visit in 11/08 with her PCP.

## 2018-07-18 ENCOUNTER — Other Ambulatory Visit (HOSPITAL_COMMUNITY): Payer: Self-pay | Admitting: Internal Medicine

## 2018-07-18 DIAGNOSIS — R911 Solitary pulmonary nodule: Secondary | ICD-10-CM

## 2018-08-04 ENCOUNTER — Ambulatory Visit (HOSPITAL_COMMUNITY): Payer: Medicare Other

## 2018-08-09 ENCOUNTER — Ambulatory Visit (HOSPITAL_COMMUNITY)
Admission: RE | Admit: 2018-08-09 | Discharge: 2018-08-09 | Disposition: A | Discharge status: Home / Self Care | Payer: Medicare Other | Source: Ambulatory Visit | Attending: Internal Medicine | Admitting: Internal Medicine

## 2018-08-09 DIAGNOSIS — Z951 Presence of aortocoronary bypass graft: Secondary | ICD-10-CM | POA: Diagnosis not present

## 2018-08-09 DIAGNOSIS — R918 Other nonspecific abnormal finding of lung field: Secondary | ICD-10-CM | POA: Diagnosis not present

## 2018-08-09 DIAGNOSIS — R911 Solitary pulmonary nodule: Secondary | ICD-10-CM | POA: Insufficient documentation

## 2018-08-09 DIAGNOSIS — I700 Atherosclerosis of aorta: Secondary | ICD-10-CM | POA: Insufficient documentation

## 2018-08-10 ENCOUNTER — Other Ambulatory Visit: Payer: Self-pay | Admitting: Cardiology

## 2018-08-17 DIAGNOSIS — Z9181 History of falling: Secondary | ICD-10-CM | POA: Diagnosis not present

## 2018-08-17 DIAGNOSIS — R911 Solitary pulmonary nodule: Secondary | ICD-10-CM | POA: Diagnosis not present

## 2018-08-21 ENCOUNTER — Encounter: Payer: Self-pay | Admitting: Cardiology

## 2018-08-23 DIAGNOSIS — R7301 Impaired fasting glucose: Secondary | ICD-10-CM | POA: Diagnosis not present

## 2018-08-23 DIAGNOSIS — Z7982 Long term (current) use of aspirin: Secondary | ICD-10-CM | POA: Diagnosis not present

## 2018-08-23 DIAGNOSIS — G9009 Other idiopathic peripheral autonomic neuropathy: Secondary | ICD-10-CM | POA: Diagnosis not present

## 2018-08-23 DIAGNOSIS — E782 Mixed hyperlipidemia: Secondary | ICD-10-CM | POA: Diagnosis not present

## 2018-08-23 DIAGNOSIS — E785 Hyperlipidemia, unspecified: Secondary | ICD-10-CM | POA: Diagnosis not present

## 2018-08-23 DIAGNOSIS — I10 Essential (primary) hypertension: Secondary | ICD-10-CM | POA: Diagnosis not present

## 2018-08-23 DIAGNOSIS — E039 Hypothyroidism, unspecified: Secondary | ICD-10-CM | POA: Diagnosis not present

## 2018-08-30 DIAGNOSIS — E039 Hypothyroidism, unspecified: Secondary | ICD-10-CM | POA: Diagnosis not present

## 2018-08-30 DIAGNOSIS — M79606 Pain in leg, unspecified: Secondary | ICD-10-CM | POA: Diagnosis not present

## 2018-08-30 DIAGNOSIS — E782 Mixed hyperlipidemia: Secondary | ICD-10-CM | POA: Diagnosis not present

## 2018-08-30 DIAGNOSIS — I25119 Atherosclerotic heart disease of native coronary artery with unspecified angina pectoris: Secondary | ICD-10-CM | POA: Diagnosis not present

## 2018-08-30 DIAGNOSIS — I10 Essential (primary) hypertension: Secondary | ICD-10-CM | POA: Diagnosis not present

## 2018-08-30 DIAGNOSIS — R3981 Functional urinary incontinence: Secondary | ICD-10-CM | POA: Diagnosis not present

## 2018-08-30 DIAGNOSIS — M545 Low back pain: Secondary | ICD-10-CM | POA: Diagnosis not present

## 2018-08-30 DIAGNOSIS — R7301 Impaired fasting glucose: Secondary | ICD-10-CM | POA: Diagnosis not present

## 2018-08-30 DIAGNOSIS — G9009 Other idiopathic peripheral autonomic neuropathy: Secondary | ICD-10-CM | POA: Diagnosis not present

## 2018-09-07 ENCOUNTER — Telehealth: Payer: Self-pay | Admitting: Cardiology

## 2018-09-07 NOTE — Telephone Encounter (Signed)
New Message;     Jerene Pitch from Dr Josue Hector office called to say that  Pt's daughter requested that you would not discuss pt's xray tomorrow when she comes for her appointment, please.

## 2018-09-11 ENCOUNTER — Encounter: Payer: Self-pay | Admitting: Cardiology

## 2018-09-11 ENCOUNTER — Ambulatory Visit (INDEPENDENT_AMBULATORY_CARE_PROVIDER_SITE_OTHER): Payer: Medicare Other | Admitting: Cardiology

## 2018-09-11 VITALS — BP 130/78 | HR 72 | Ht 64.0 in | Wt 134.8 lb

## 2018-09-11 DIAGNOSIS — I2510 Atherosclerotic heart disease of native coronary artery without angina pectoris: Secondary | ICD-10-CM

## 2018-09-11 DIAGNOSIS — I10 Essential (primary) hypertension: Secondary | ICD-10-CM

## 2018-09-11 DIAGNOSIS — E785 Hyperlipidemia, unspecified: Secondary | ICD-10-CM | POA: Diagnosis not present

## 2018-09-11 DIAGNOSIS — I350 Nonrheumatic aortic (valve) stenosis: Secondary | ICD-10-CM | POA: Diagnosis not present

## 2018-09-11 NOTE — Patient Instructions (Signed)
Medication Instructions:  Your physician recommends that you continue on your current medications as directed. Please refer to the Current Medication list given to you today.  If you need a refill on your cardiac medications before your next appointment, please call your pharmacy.   Lab work:  If you have labs (blood work) drawn today and your tests are completely normal, you will receive your results only by: . MyChart Message (if you have MyChart) OR . A paper copy in the mail If you have any lab test that is abnormal or we need to change your treatment, we will call you to review the results.  Follow-Up: At [facility], you and your health needs are our priority.  As part of our continuing mission to provide you with exceptional heart care, we have created designated Provider Care Teams.  These Care Teams include your primary Cardiologist (physician) and Advanced Practice Providers (APPs -  Physician Assistants and Nurse Practitioners) who all work together to provide you with the care you need, when you need it. You will need a follow up appointment in 1 years.  Please call our office 2 months in advance to schedule this appointment.  You may see Traci Turner, MD or one of the following Advanced Practice Providers on your designated Care Team:   Brittainy Simmons, PA-C Dayna Dunn, PA-C . Michele Lenze, PA-C    

## 2018-09-11 NOTE — Progress Notes (Signed)
Cardiology Office Note:    Date:  09/11/2018   ID:  Christine Juarez, DOB [birth year], MRN [id number]  PCP:  Celene Squibb, MD  Cardiologist:  Fransico Him, MD    Referring MD: Celene Squibb, MD   Chief Complaint  Patient presents with  . Coronary Artery Disease  . Hypertension  . Hyperlipidemia    History of Present Illness:    Christine Juarez is a 84 y.o. female with a hx of ASCAD s/p remote CABG, HTN, and dyslipidemia. Shde is here today for followup and is doing well.  She denies any chest pain or pressure, SOB, DOE, PND, orthopnea, LE edema, dizziness, palpitations or syncope. She is compliant with her meds and is tolerating meds with no SE.    Past Medical History:  Diagnosis Date  . Aortic stenosis    mild by echo 08/2015  . Arthritis   . CAD (coronary artery disease), native coronary artery    s/p remote CABG  . HTN (hypertension)   . Hyperlipidemia   . Neuropathy   . Thyroid disease     Past Surgical History:  Procedure Laterality Date  . APPENDECTOMY    . ARTERIAL BYPASS SURGRY    . BACK SURGERY    . COLONOSCOPY    . LIPOMA EXCISION    . SKULL FRACTURE ELEVATION    . UPPER GASTROINTESTINAL ENDOSCOPY      Current Medications: Current Meds  Medication Sig  . aspirin 81 MG tablet Take 81 mg by mouth daily.    . Coenzyme Q10 (COQ10) 100 MG CAPS Take 1 capsule by mouth daily.   Marland Kitchen ezetimibe (ZETIA) 10 MG tablet TAKE 1 TABLET BY MOUTH ONCE DAILY FOR CHOLESTEROL.  Marland Kitchen isosorbide mononitrate (IMDUR) 60 MG 24 hr tablet Take 1 tablet (60 mg total) by mouth daily. Please keep upcoming appt with Dr. Radford Pax for November for future refills. Thank you  . Multiple Vitamins-Minerals (OCUVITE PO) Take 1 tablet by mouth daily.  . NORVASC 5 MG tablet TAKE (1) TABLET BY MOUTH ONCE DAILY.  Marland Kitchen omeprazole (PRILOSEC) 20 MG capsule Take 20 mg by mouth daily.  Marland Kitchen SYNTHROID 88 MCG tablet Take 88 mcg by mouth daily before breakfast.     Allergies:   Codeine; Dicyclomine hcl; Erythromycin;  Lodine [etodolac]; Meclizine hcl; Niacin; and Statins   Social History   Socioeconomic History  . Marital status: Widowed    Spouse name: Not on file  . Number of children: Not on file  . Years of education: 98  . Highest education level: Not on file  Occupational History  . Not on file  Social Needs  . Financial resource strain: Not on file  . Food insecurity:    Worry: Not on file    Inability: Not on file  . Transportation needs:    Medical: Not on file    Non-medical: Not on file  Tobacco Use  . Smoking status: Never Smoker  . Smokeless tobacco: Never Used  Substance and Sexual Activity  . Alcohol use: No  . Drug use: Never  . Sexual activity: Not on file  Lifestyle  . Physical activity:    Days per week: Not on file    Minutes per session: Not on file  . Stress: Not on file  Relationships  . Social connections:    Talks on phone: Not on file    Gets together: Not on file    Attends religious service: Not on file  Active member of club or organization: Not on file    Attends meetings of clubs or organizations: Not on file    Relationship status: Not on file  Other Topics Concern  . Not on file  Social History Narrative  . Not on file     Family History: The patient's family history includes Alzheimer's disease in her sister; Anxiety disorder in her daughter; Arthritis in her unknown relative; Cancer in her unknown relative; Colon cancer in her sister and sister; GER disease in her daughter; Hyperlipidemia in her daughter; Hypertension in her daughter and son; Ovarian cancer in her sister.  ROS:   Please see the history of present illness.    ROS  All other systems reviewed and negative.   EKGs/Labs/Other Studies Reviewed:    The following studies were reviewed today: none  EKG:  EKG is not  ordered today.    Recent Labs: 03/25/2018: Magnesium 1.8 03/27/2018: TSH 4.224 04/04/2018: ALT 15; BUN 15; Creatinine, Ser 0.64; Hemoglobin 13.8; Platelets 317;  Potassium 3.7; Sodium 140   Recent Lipid Panel    Component Value Date/Time   CHOL 160 01/27/2015 1033   TRIG 105.0 01/27/2015 1033   HDL 56.90 01/27/2015 1033   CHOLHDL 3 01/27/2015 1033   VLDL 21.0 01/27/2015 1033   LDLCALC 82 01/27/2015 1033    Physical Exam:    VS:  BP 130/78   Pulse 72   Ht 5\' 4"  (1.626 m)   Wt 134 lb 12.8 oz (61.1 kg)   SpO2 96%   BMI 23.14 kg/m     Wt Readings from Last 3 Encounters:  09/11/18 134 lb 12.8 oz (61.1 kg)  03/24/18 137 lb 12.6 oz (62.5 kg)  08/10/17 131 lb 3.2 oz (59.5 kg)     GEN:  Well nourished, well developed in no acute distress HEENT: Normal NECK: No JVD; No carotid bruits LYMPHATICS: No lymphadenopathy CARDIAC: RRR, no murmurs, rubs, gallops RESPIRATORY:  Clear to auscultation without rales, wheezing or rhonchi  ABDOMEN: Soft, non-tender, non-distended MUSCULOSKELETAL:  No edema; No deformity  SKIN: Warm and dry NEUROLOGIC:  Alert and oriented x 3 PSYCHIATRIC:  Normal affect   ASSESSMENT:    1. Coronary artery disease involving native coronary artery of native heart without angina pectoris   2. Essential hypertension   3. Nonrheumatic aortic valve stenosis   4. Dyslipidemia    PLAN:    In order of problems listed above:  1.  ASCAD -status post remote CABG.  She denies any anginal symptoms.  She will continue on aspirin 81 mg daily, Imdur 60 mg daily.  She is statin intolerant given her advanced age would not pursue PCSK9 inhibitor.  2.  Hypertension - BP is well controlled on exam today.  She will continue on amlodipine 5 mg daily.  3.  Aortic stenosis - this was mild with mean gradient 12 mmHg by echo 08/31/2016.  Given her advanced age would not pursue repeat echo as she would not be a candidate for invasive procedures she is asymptomatic.  4.  Hyperlipidemia - her last LDL was 124 on 08/30/2018.  She is statin intolerant.  She will continue on Zetia 10mg  daily.  As stated above, would not pursue PCSK9 inhibitor  given her advanced age.    Medication Adjustments/Labs and Tests Ordered: Current medicines are reviewed at length with the patient today.  Concerns regarding medicines are outlined above.  No orders of the defined types were placed in this encounter.  No orders of  the defined types were placed in this encounter.   Signed, Fransico Him, MD  09/11/2018 2:34 PM    [facility]

## 2018-10-05 ENCOUNTER — Other Ambulatory Visit: Payer: Self-pay | Admitting: Cardiology

## 2018-10-30 ENCOUNTER — Other Ambulatory Visit: Payer: Self-pay | Admitting: Cardiology

## 2018-11-27 ENCOUNTER — Other Ambulatory Visit: Payer: Self-pay | Admitting: Cardiology

## 2018-12-18 ENCOUNTER — Telehealth: Payer: Self-pay | Admitting: Cardiology

## 2018-12-18 NOTE — Telephone Encounter (Signed)
Spoke with the patient's daughter, she stated that her mother has had another TIA event on Saturday that lasted a few minutes. Her face and elbow was numb. She also has had headaches, but the daughter believes that they might be seasonal allergy. Her last event was 4-5 weeks ago. I advised that the patient should contact her PCP.

## 2018-12-18 NOTE — Telephone Encounter (Signed)
Spoke with the patient's daughter, she expressed understanding and is going to go to her PCP.

## 2018-12-18 NOTE — Telephone Encounter (Signed)
  Daughter is calling because patient has hx of mini strokes. Daughter is reporting that her mother had an episode Saturday where her face felt numb. She has also experienced some headaches recently. Daughter is worried that maybe she is having mini strokes again and would like to get some guidance.

## 2018-12-18 NOTE — Telephone Encounter (Signed)
Needs to see PCP ASAP 

## 2019-01-23 ENCOUNTER — Telehealth: Payer: Self-pay | Admitting: Cardiology

## 2019-01-23 DIAGNOSIS — R600 Localized edema: Secondary | ICD-10-CM | POA: Diagnosis not present

## 2019-01-23 DIAGNOSIS — M25561 Pain in right knee: Secondary | ICD-10-CM | POA: Diagnosis not present

## 2019-01-23 NOTE — Telephone Encounter (Signed)
Spoke with the patient and her daughter. She called PCP today and got prescription topical cream for her leg because she has pain behind her knee and to the midline. The patient states the pain is pretty constant and will get better or worse at random. The patient states her swelling in her legs in a little worse then normal but not much. She stated this is the first time she has had pain like this in her knee. The PCP wanted cardiology to be aware.

## 2019-01-23 NOTE — Telephone Encounter (Signed)
Daughter called on behalf of pt. Pt states that she is having swelling in her feet and legs. On  03/26, she started having pain in her knee and has had to use her walker for the past two weeks.The pain has moved down her leg  The pt talked to her PCP, and her PCP prescribed a topical cream if this may just be arthritis. The PCP advised that the daughter call the cardiologist to see if there was a way to adjust the pt's medications.   The daughter does have pictures of the swelling if necessary

## 2019-01-23 NOTE — Telephone Encounter (Signed)
The daughter provided her home number if you can not reach her on her mobile number.  [phone]

## 2019-01-24 NOTE — Telephone Encounter (Signed)
It is unlikely the amlodipine which she has been on for years.  I am concerned with her pain behind the knee that she may have a DVT.  Please find out from patient is she has had any SOB and if she physically saw her PCP

## 2019-01-25 NOTE — Telephone Encounter (Signed)
Spoke with the patient's daughter, she has not been SOB. He pain today was a little better. PCP prescribed Voltaren gel, she has not used it yet and was worried since it had warnings about cardiac problems. She did not physically see PCP, just a telephone call.

## 2019-01-26 NOTE — Telephone Encounter (Signed)
Voltaren get is fine but is sx worsen over the weekend or do not resolve by Monday she needs to be seen by her pCP

## 2019-01-29 NOTE — Telephone Encounter (Signed)
Attempted will call tomorrow.

## 2019-01-30 NOTE — Telephone Encounter (Signed)
Spoke with the patient, she is doing better. She says she still does not walk around too much. Her PCP has not seen her in-person and is not doing video visits. She still has pain around her knee and she gets ankle edema after standing for awhile.

## 2019-01-31 NOTE — Telephone Encounter (Signed)
Please get LE venous doppler bilateral to rule out DVT

## 2019-02-01 NOTE — Telephone Encounter (Signed)
Attempted to call, no voicemail option.

## 2019-02-06 NOTE — Telephone Encounter (Signed)
Spoke with the patient's daughter, she stated she would talk with her mother to see if she wanted to have a doppler. She will call the office back to confirm.

## 2019-03-14 DIAGNOSIS — Z0000 Encounter for general adult medical examination without abnormal findings: Secondary | ICD-10-CM | POA: Diagnosis not present

## 2019-03-30 DIAGNOSIS — E782 Mixed hyperlipidemia: Secondary | ICD-10-CM | POA: Diagnosis not present

## 2019-03-30 DIAGNOSIS — I10 Essential (primary) hypertension: Secondary | ICD-10-CM | POA: Diagnosis not present

## 2019-03-30 DIAGNOSIS — E785 Hyperlipidemia, unspecified: Secondary | ICD-10-CM | POA: Diagnosis not present

## 2019-03-30 DIAGNOSIS — E039 Hypothyroidism, unspecified: Secondary | ICD-10-CM | POA: Diagnosis not present

## 2019-03-30 DIAGNOSIS — Z1329 Encounter for screening for other suspected endocrine disorder: Secondary | ICD-10-CM | POA: Diagnosis not present

## 2019-04-04 DIAGNOSIS — I10 Essential (primary) hypertension: Secondary | ICD-10-CM | POA: Diagnosis not present

## 2019-04-04 DIAGNOSIS — G9009 Other idiopathic peripheral autonomic neuropathy: Secondary | ICD-10-CM | POA: Diagnosis not present

## 2019-04-04 DIAGNOSIS — M79606 Pain in leg, unspecified: Secondary | ICD-10-CM | POA: Diagnosis not present

## 2019-04-04 DIAGNOSIS — E039 Hypothyroidism, unspecified: Secondary | ICD-10-CM | POA: Diagnosis not present

## 2019-04-04 DIAGNOSIS — R1319 Other dysphagia: Secondary | ICD-10-CM | POA: Diagnosis not present

## 2019-04-04 DIAGNOSIS — R2241 Localized swelling, mass and lump, right lower limb: Secondary | ICD-10-CM | POA: Diagnosis not present

## 2019-04-04 DIAGNOSIS — R7301 Impaired fasting glucose: Secondary | ICD-10-CM | POA: Diagnosis not present

## 2019-04-04 DIAGNOSIS — I25119 Atherosclerotic heart disease of native coronary artery with unspecified angina pectoris: Secondary | ICD-10-CM | POA: Diagnosis not present

## 2019-04-04 DIAGNOSIS — M545 Low back pain: Secondary | ICD-10-CM | POA: Diagnosis not present

## 2019-04-04 DIAGNOSIS — E782 Mixed hyperlipidemia: Secondary | ICD-10-CM | POA: Diagnosis not present

## 2019-04-04 DIAGNOSIS — R3981 Functional urinary incontinence: Secondary | ICD-10-CM | POA: Diagnosis not present

## 2019-05-02 DIAGNOSIS — I700 Atherosclerosis of aorta: Secondary | ICD-10-CM | POA: Diagnosis not present

## 2019-05-02 DIAGNOSIS — E8809 Other disorders of plasma-protein metabolism, not elsewhere classified: Secondary | ICD-10-CM | POA: Diagnosis not present

## 2019-05-02 DIAGNOSIS — J84115 Respiratory bronchiolitis interstitial lung disease: Secondary | ICD-10-CM | POA: Diagnosis not present

## 2019-05-02 DIAGNOSIS — I2510 Atherosclerotic heart disease of native coronary artery without angina pectoris: Secondary | ICD-10-CM | POA: Diagnosis not present

## 2019-05-02 DIAGNOSIS — R32 Unspecified urinary incontinence: Secondary | ICD-10-CM | POA: Diagnosis not present

## 2019-05-02 DIAGNOSIS — Z9181 History of falling: Secondary | ICD-10-CM | POA: Diagnosis not present

## 2019-05-02 DIAGNOSIS — R918 Other nonspecific abnormal finding of lung field: Secondary | ICD-10-CM | POA: Diagnosis not present

## 2019-05-02 DIAGNOSIS — R1319 Other dysphagia: Secondary | ICD-10-CM | POA: Diagnosis not present

## 2019-05-02 DIAGNOSIS — E039 Hypothyroidism, unspecified: Secondary | ICD-10-CM | POA: Diagnosis not present

## 2019-05-02 DIAGNOSIS — I10 Essential (primary) hypertension: Secondary | ICD-10-CM | POA: Diagnosis not present

## 2019-05-02 DIAGNOSIS — G629 Polyneuropathy, unspecified: Secondary | ICD-10-CM | POA: Diagnosis not present

## 2019-05-02 DIAGNOSIS — R2241 Localized swelling, mass and lump, right lower limb: Secondary | ICD-10-CM | POA: Diagnosis not present

## 2019-05-02 DIAGNOSIS — M545 Low back pain: Secondary | ICD-10-CM | POA: Diagnosis not present

## 2019-05-02 DIAGNOSIS — Z6825 Body mass index (BMI) 25.0-25.9, adult: Secondary | ICD-10-CM | POA: Diagnosis not present

## 2019-05-02 DIAGNOSIS — R7301 Impaired fasting glucose: Secondary | ICD-10-CM | POA: Diagnosis not present

## 2019-05-02 DIAGNOSIS — I350 Nonrheumatic aortic (valve) stenosis: Secondary | ICD-10-CM | POA: Diagnosis not present

## 2019-05-18 ENCOUNTER — Other Ambulatory Visit: Payer: Self-pay

## 2019-06-04 IMAGING — CT CT CHEST W/O CM
2 of 3 series · 15 of 36 positions shown, 18 images · non-contrast
Comparison: No priors.

CLINICAL DATA: [AGE] female with abnormal lung sounds. Recent
pubic ramus fracture.

EXAM:
CT CHEST WITHOUT CONTRAST
TECHNIQUE: Multidetector CT imaging of the chest was performed following the
standard protocol without IV contrast.

[Series 2: thorax · axial · 0.60mm/px · z∈[-483,-265]mm · 12 of 129 slices shown, 15 images]
[im 10/129  mediastinal]
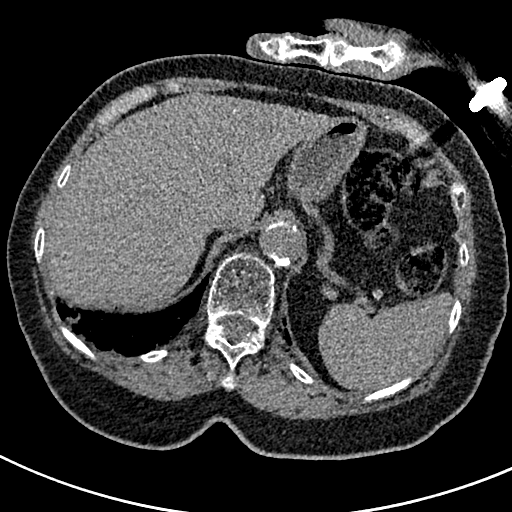
[im 10/129  lung]
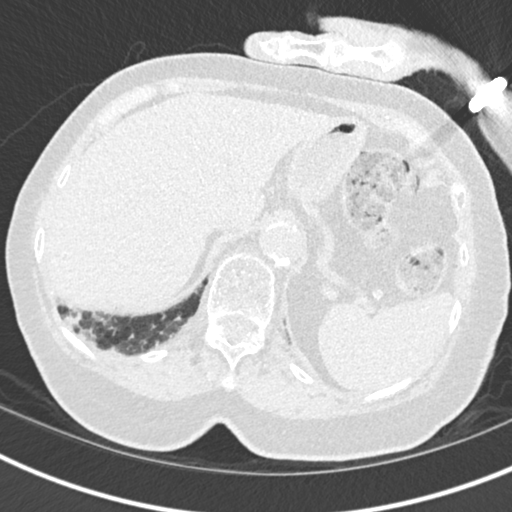
[im 19/129  lung]
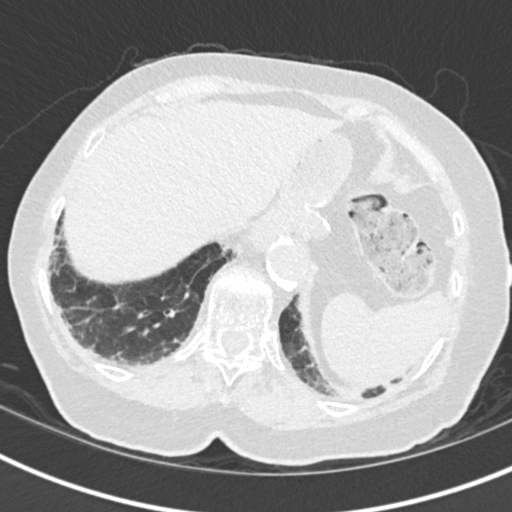
[im 29/129  lung]
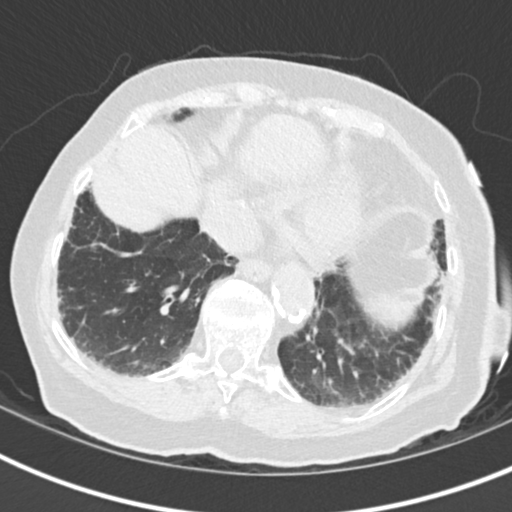
[im 38/129  lung]
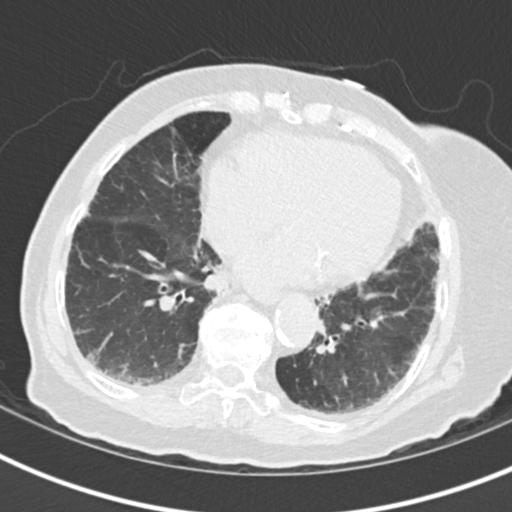
[im 48/129  mediastinal]
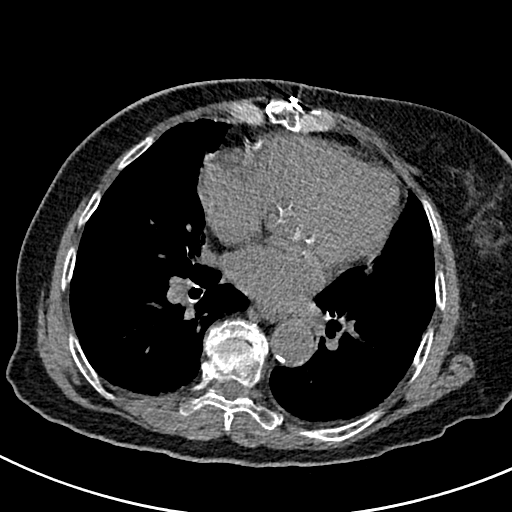
[im 48/129  lung]
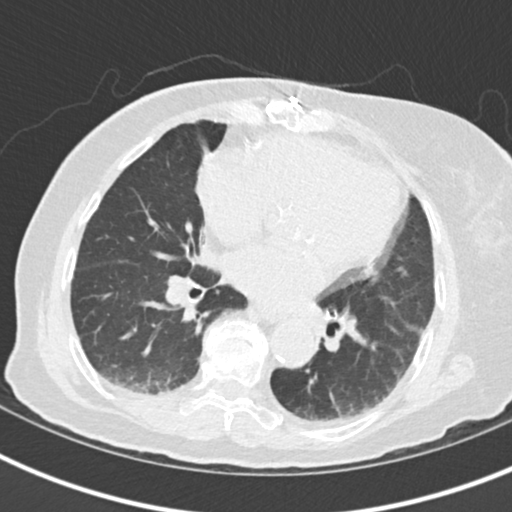
[im 57/129  lung]
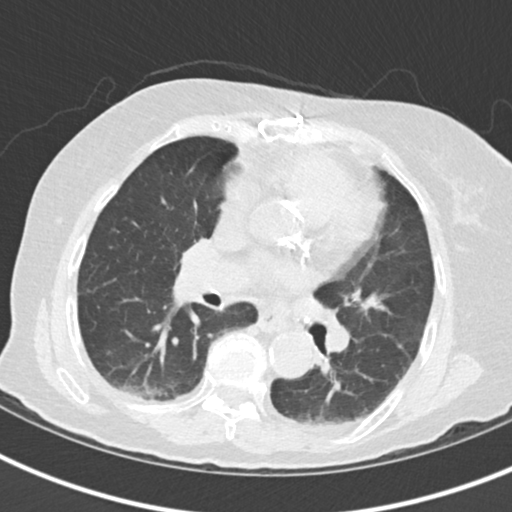
[im 72/129  lung]
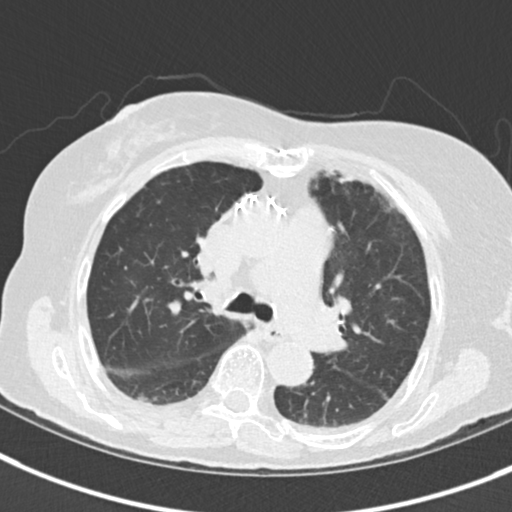
[im 81/129  lung]
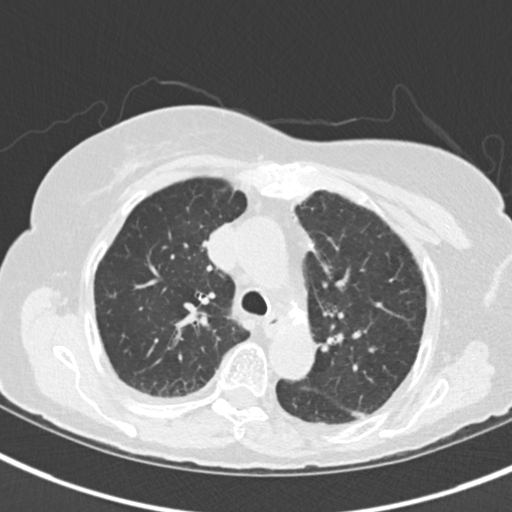
[im 91/129  mediastinal]
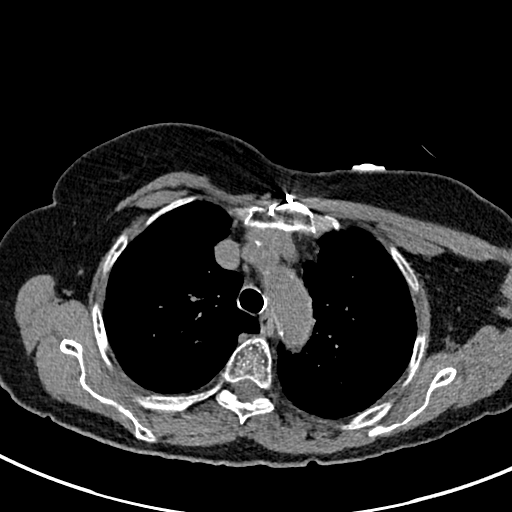
[im 91/129  lung]
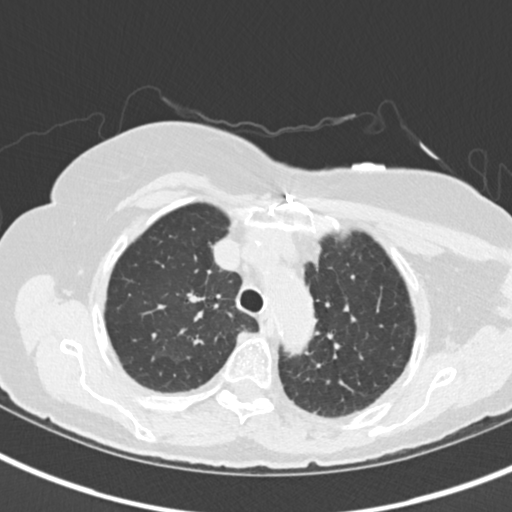
[im 100/129  lung]
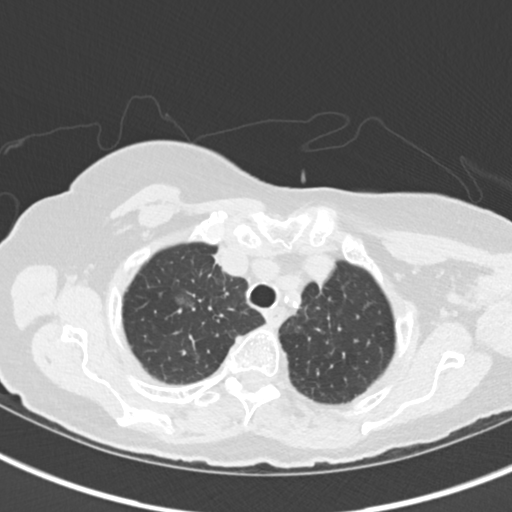
[im 110/129  lung]
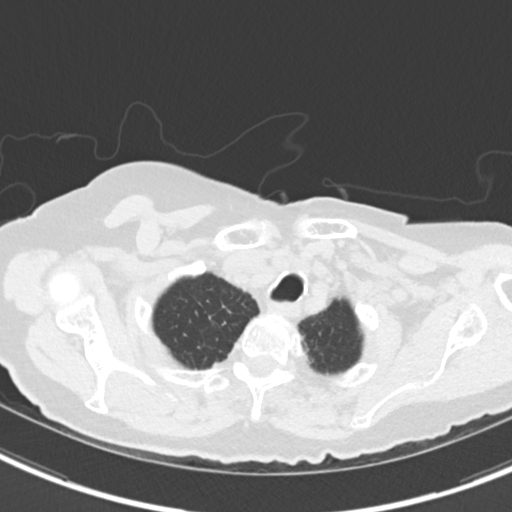
[im 119/129  lung]
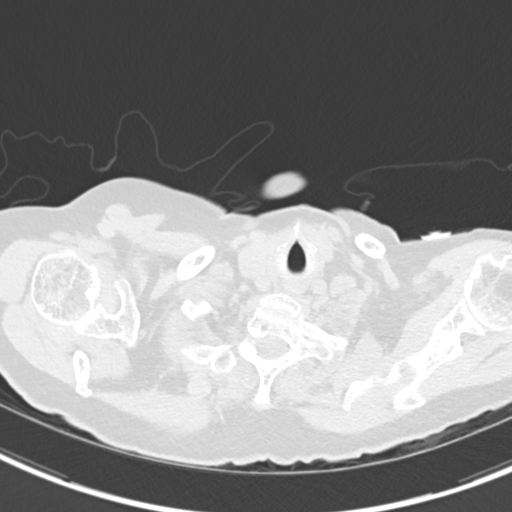

[Series 5: coronal · coronal · 0.55mm/px · 3 of 116 slices shown]
[im 24/116  lung]
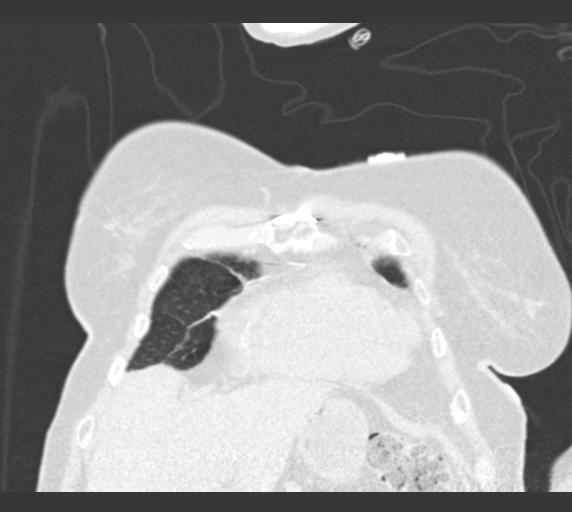
[im 47/116  lung]
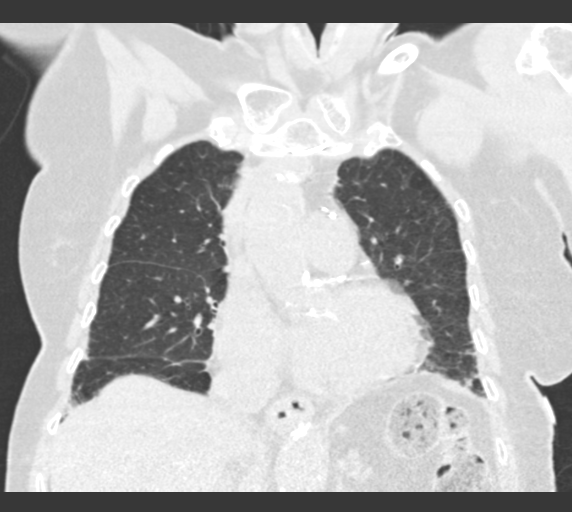
[im 70/116  lung]
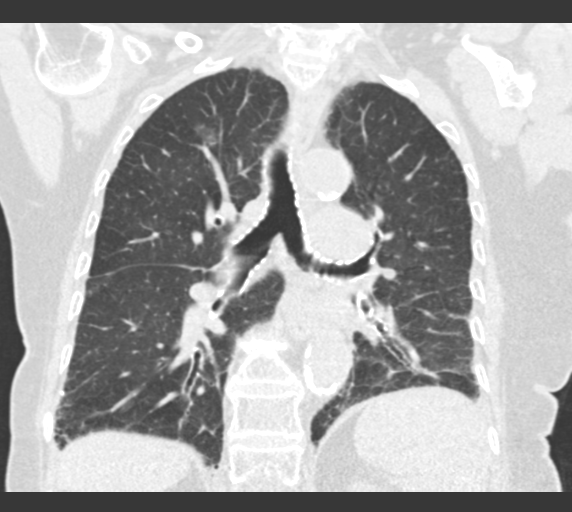

[15 of 36 positions shown; findings below may reference images not displayed]

FINDINGS: Cardiovascular: Heart size is normal. There is no significant
pericardial fluid, thickening or pericardial calcification. There is
aortic atherosclerosis, as well as atherosclerosis of the great
vessels of the mediastinum and the coronary arteries, including
calcified atherosclerotic plaque in the left main, left anterior
descending, left circumflex and right coronary arteries. Status post
median sternotomy for CABG including [REDACTED] to the LAD. Severe
calcifications of the aortic valve.

Mediastinum/Nodes: No pathologically enlarged mediastinal or hilar
lymph nodes. Please note that accurate exclusion of hilar adenopathy
is limited on noncontrast CT scans. Small hiatal hernia. No axillary
lymphadenopathy.

Lungs/Pleura: 10 x 14 mm ground-glass attenuation nodule in the
right upper lobe near the apex (axial image 31 of series 4). No
other suspicious appearing pulmonary nodules or masses are noted. No
acute consolidative airspace disease. No pleural effusions.
Widespread septal thickening throughout the periphery of the lung
bases. Mild cylindrical bronchiectasis and peripheral
bronchiolectasis also noted in the lung bases.

Upper Abdomen: Aortic atherosclerosis. Calcified granuloma in the
left lobe of the liver.

Musculoskeletal: Nondisplaced fracture of the anterolateral aspect
of the left sixth rib (axial image 94 of series 4). Median
sternotomy wires. There are no aggressive appearing lytic or blastic
lesions noted in the visualized portions of the skeleton.
IMPRESSION: 1. The appearance of the lungs suggests potential interstitial lung
disease. Outpatient referral to Pulmonology for further evaluation
is recommended.
2. 10 x 14 mm ground-glass attenuation nodule in the right upper
lobe near the apex. No central solid component at this time to
suggest an invasive malignancy. However, attention on any future
follow-up chest CTs is suggested.
3. Aortic atherosclerosis, in addition to left main and 3 vessel
coronary artery disease. Status post median sternotomy for CABG
including [REDACTED] to the LAD.
4. Small hiatal hernia.

Aortic Atherosclerosis ([id number]).

## 2019-07-20 ENCOUNTER — Telehealth (INDEPENDENT_AMBULATORY_CARE_PROVIDER_SITE_OTHER): Payer: Medicare Other | Admitting: Cardiology

## 2019-07-20 ENCOUNTER — Other Ambulatory Visit: Payer: Self-pay

## 2019-07-20 ENCOUNTER — Encounter: Payer: Self-pay | Admitting: *Deleted

## 2019-07-20 VITALS — BP 138/69 | HR 65 | Ht 61.5 in | Wt 131.0 lb

## 2019-07-20 DIAGNOSIS — I350 Nonrheumatic aortic (valve) stenosis: Secondary | ICD-10-CM | POA: Diagnosis not present

## 2019-07-20 DIAGNOSIS — E785 Hyperlipidemia, unspecified: Secondary | ICD-10-CM | POA: Diagnosis not present

## 2019-07-20 DIAGNOSIS — I10 Essential (primary) hypertension: Secondary | ICD-10-CM

## 2019-07-20 DIAGNOSIS — I2510 Atherosclerotic heart disease of native coronary artery without angina pectoris: Secondary | ICD-10-CM

## 2019-07-20 MED ORDER — NORVASC 5 MG PO TABS: ORAL_TABLET | ORAL | 10 refills | Status: DC

## 2019-07-20 MED ORDER — EZETIMIBE 10 MG PO TABS: ORAL_TABLET | ORAL | 11 refills | Status: DC

## 2019-07-20 MED ORDER — ISOSORBIDE MONONITRATE ER 60 MG PO TB24: 60.0000 mg | ORAL_TABLET | Freq: Every day | ORAL | 3 refills | Status: DC

## 2019-07-20 NOTE — Patient Instructions (Signed)
Medication Instructions:   Your physician recommends that you continue on your current medications as directed. Please refer to the Current Medication list given to you today.  If you need a refill on your cardiac medications before your next appointment, please call your pharmacy.    Follow-Up: At [facility], you and your health needs are our priority.  As part of our continuing mission to provide you with exceptional heart care, we have created designated Provider Care Teams.  These Care Teams include your primary Cardiologist (physician) and Advanced Practice Providers (APPs -  Physician Assistants and Nurse Practitioners) who all work together to provide you with the care you need, when you need it.  Your physician wants you to follow-up in: [place] will receive a reminder letter in the mail two months in advance. If you don't receive a letter, please call our office to schedule the follow-up appointment.

## 2019-07-20 NOTE — Progress Notes (Signed)
Virtual Visit via Video Note   This visit type was conducted due to national recommendations for restrictions regarding the COVID-19 Pandemic (e.g. social distancing) in an effort to limit this patient's exposure and mitigate transmission in our community.  Due to her co-morbid illnesses, this patient is at least at moderate risk for complications without adequate follow up.  This format is felt to be most appropriate for this patient at this time.  All issues noted in this document were discussed and addressed.  A limited physical exam was performed with this format.  Please refer to the patient's chart for her consent to telehealth for [facility].   Evaluation Performed:  Follow-up visit  This visit type was conducted due to national recommendations for restrictions regarding the COVID-19 Pandemic (e.g. social distancing).  This format is felt to be most appropriate for this patient at this time.  All issues noted in this document were discussed and addressed.  No physical exam was performed (except for noted visual exam findings with Video Visits).  Please refer to the patient's chart (MyChart message for video visits and phone note for telephone visits) for the patient's consent to telehealth for [facility].  Date:  07/20/2019   ID:  Christine Juarez, DOB July 06, 1920, MRN [id number]  Patient Location:  Home  Provider location:   [place]  PCP:  Celene Squibb, MD  Cardiologist:  Fransico Him, MD  Electrophysiologist:  None   Chief Complaint:  CAD, HTN, HLD  History of Present Illness:    Christine Juarez is a 85 y.o. female who presents via audio/video conferencing for a telehealth visit today.    Christine Juarez is a 85 y.o. female with a hx of ASCADs/p remote CABG, HTN, and dyslipidemia.  he is here today for followup and is doing well.  He denies any chest pain or pressure, SOB, DOE, PND, orthopnea, LE edema, dizziness, palpitations or syncope. He is compliant with his meds and  is tolerating meds with no SE.    The patient does not have symptoms concerning for COVID-19 infection (fever, chills, cough, or new shortness of breath).    Prior CV studies:   The following studies were reviewed today:  none  Past Medical History:  Diagnosis Date  . Aortic stenosis    mild by echo 08/2015  . Arthritis   . CAD (coronary artery disease), native coronary artery    s/p remote CABG  . HTN (hypertension)   . Hyperlipidemia   . Neuropathy   . Thyroid disease    Past Surgical History:  Procedure Laterality Date  . APPENDECTOMY    . ARTERIAL BYPASS SURGRY    . BACK SURGERY    . COLONOSCOPY    . LIPOMA EXCISION    . SKULL FRACTURE ELEVATION    . UPPER GASTROINTESTINAL ENDOSCOPY       Current Meds  Medication Sig  . aspirin 81 MG chewable tablet Chew 81 mg by mouth daily. Before bed  . aspirin 81 MG tablet Take 81 mg by mouth daily.    . Coenzyme Q10 (COQ10) 100 MG CAPS Take 1 capsule by mouth daily.   . Cyanocobalamin (VITAMIN B-12 SL) Place under the tongue daily.  Marland Kitchen ezetimibe (ZETIA) 10 MG tablet TAKE 1 TABLET BY MOUTH ONCE DAILY FOR CHOLESTEROL.  Marland Kitchen isosorbide mononitrate (IMDUR) 60 MG 24 hr tablet TAKE 1 TABLET BY MOUTH ONCE DAILY.  . NORVASC 5 MG tablet TAKE (1) TABLET BY MOUTH ONCE DAILY.  Marland Kitchen  pantoprazole (PROTONIX) 40 MG tablet Take 40 mg by mouth daily.  Marland Kitchen SYNTHROID 88 MCG tablet Take 100 mcg by mouth daily before breakfast.      Allergies:   Codeine, Dicyclomine hcl, Erythromycin, Lodine [etodolac], Meclizine hcl, Niacin, and Statins   Social History   Tobacco Use  . Smoking status: Never Smoker  . Smokeless tobacco: Never Used  Substance Use Topics  . Alcohol use: No  . Drug use: Never     Family Hx: The patient's family history includes Alzheimer's disease in her sister; Anxiety disorder in her daughter; Arthritis in her unknown relative; Cancer in her unknown relative; Colon cancer in her sister and sister; GER disease in her daughter;  Hyperlipidemia in her daughter; Hypertension in her daughter and son; Ovarian cancer in her sister.  ROS:   Please see the history of present illness.     All other systems reviewed and are negative.   Labs/Other Tests and Data Reviewed:    Recent Labs: No results found for requested labs within last 8760 hours.   Recent Lipid Panel Lab Results  Component Value Date/Time   CHOL 160 01/27/2015 10:33 AM   TRIG 105.0 01/27/2015 10:33 AM   HDL 56.90 01/27/2015 10:33 AM   CHOLHDL 3 01/27/2015 10:33 AM   LDLCALC 82 01/27/2015 10:33 AM    Wt Readings from Last 3 Encounters:  07/20/19 131 lb (59.4 kg)  09/11/18 134 lb 12.8 oz (61.1 kg)  03/24/18 137 lb 12.6 oz (62.5 kg)     Objective:    Vital Signs:  BP 138/69   Pulse 65   Ht 5' 1.5" (1.562 m)   Wt 131 lb (59.4 kg)   BMI 24.35 kg/m    CONSTITUTIONAL:  Well nourished, well developed female in no acute distress.  EYES: anicteric MOUTH: oral mucosa is pink RESPIRATORY: Normal respiratory effort, symmetric expansion CARDIOVASCULAR: No peripheral edema SKIN: No rash, lesions or ulcers MUSCULOSKELETAL: no digital cyanosis NEURO: Cranial Nerves II-XII grossly intact, moves all extremities PSYCH: Intact judgement and insight.  A&O x 3, Mood/affect appropriate   ASSESSMENT & PLAN:    1.  ASCAD -s/p remote CABG -no anginal symptoms -continue ASA and Imdur -statin intolerant  2.  HTN -BP controlled on exam -continue on amlodipine 5mg  daily  3.  Aortic stenosis -mild by echo 2017 with mean AVG 35mmHg -given advanced age will not pursue repeat echo as not a candidate for invasive procedures due to advanced age and asymptomatic  4.  HLD -LDL goal < 70 -she is statin intolerant and due to advanced age PCSK27i not pursued.  COVID-19 Education: The signs and symptoms of COVID-19 were discussed with the patient and how to seek care for testing (follow up with PCP or arrange E-visit).  The importance of social distancing  was discussed today.  Patient Risk:   After full review of this patient's clinical status, I feel that they are at least moderate risk at this time.  Time:   Today, I have spent 20 minutes directly with the patient on telemedicine discussing medical problems including CAD, HTN, HLD.  We also reviewed the symptoms of COVID 19 and the ways to protect against contracting the virus with telehealth technology.  I spent an additional 5 minutes reviewing patient's chart including labs.  Medication Adjustments/Labs and Tests Ordered: Current medicines are reviewed at length with the patient today.  Concerns regarding medicines are outlined above.  Tests Ordered: No orders of the defined types were placed in  this encounter.  Medication Changes: No orders of the defined types were placed in this encounter.   Disposition:  Follow up in 1 year(s)  Signed, Fransico Him, MD  07/20/2019 12:58 PM    [place]

## 2019-10-03 DIAGNOSIS — G9009 Other idiopathic peripheral autonomic neuropathy: Secondary | ICD-10-CM | POA: Diagnosis not present

## 2019-10-03 DIAGNOSIS — E039 Hypothyroidism, unspecified: Secondary | ICD-10-CM | POA: Diagnosis not present

## 2019-10-03 DIAGNOSIS — R3981 Functional urinary incontinence: Secondary | ICD-10-CM | POA: Diagnosis not present

## 2019-10-03 DIAGNOSIS — I10 Essential (primary) hypertension: Secondary | ICD-10-CM | POA: Diagnosis not present

## 2019-10-03 DIAGNOSIS — E782 Mixed hyperlipidemia: Secondary | ICD-10-CM | POA: Diagnosis not present

## 2019-10-03 DIAGNOSIS — M545 Low back pain: Secondary | ICD-10-CM | POA: Diagnosis not present

## 2019-10-03 DIAGNOSIS — I25119 Atherosclerotic heart disease of native coronary artery with unspecified angina pectoris: Secondary | ICD-10-CM | POA: Diagnosis not present

## 2019-10-03 DIAGNOSIS — M79606 Pain in leg, unspecified: Secondary | ICD-10-CM | POA: Diagnosis not present

## 2019-10-03 DIAGNOSIS — R1319 Other dysphagia: Secondary | ICD-10-CM | POA: Diagnosis not present

## 2019-10-03 DIAGNOSIS — R7301 Impaired fasting glucose: Secondary | ICD-10-CM | POA: Diagnosis not present

## 2019-12-20 DIAGNOSIS — M79604 Pain in right leg: Secondary | ICD-10-CM | POA: Diagnosis not present

## 2020-05-13 DIAGNOSIS — M79606 Pain in leg, unspecified: Secondary | ICD-10-CM | POA: Diagnosis not present

## 2020-05-13 DIAGNOSIS — G629 Polyneuropathy, unspecified: Secondary | ICD-10-CM | POA: Diagnosis not present

## 2020-05-13 DIAGNOSIS — I10 Essential (primary) hypertension: Secondary | ICD-10-CM | POA: Diagnosis not present

## 2020-05-13 DIAGNOSIS — I2510 Atherosclerotic heart disease of native coronary artery without angina pectoris: Secondary | ICD-10-CM | POA: Diagnosis not present

## 2020-05-13 DIAGNOSIS — E782 Mixed hyperlipidemia: Secondary | ICD-10-CM | POA: Diagnosis not present

## 2020-05-13 DIAGNOSIS — R7301 Impaired fasting glucose: Secondary | ICD-10-CM | POA: Diagnosis not present

## 2020-05-13 DIAGNOSIS — G9009 Other idiopathic peripheral autonomic neuropathy: Secondary | ICD-10-CM | POA: Diagnosis not present

## 2020-05-13 DIAGNOSIS — M545 Low back pain: Secondary | ICD-10-CM | POA: Diagnosis not present

## 2020-05-13 DIAGNOSIS — E8809 Other disorders of plasma-protein metabolism, not elsewhere classified: Secondary | ICD-10-CM | POA: Diagnosis not present

## 2020-05-13 DIAGNOSIS — I350 Nonrheumatic aortic (valve) stenosis: Secondary | ICD-10-CM | POA: Diagnosis not present

## 2020-05-13 DIAGNOSIS — J84115 Respiratory bronchiolitis interstitial lung disease: Secondary | ICD-10-CM | POA: Diagnosis not present

## 2020-05-13 DIAGNOSIS — I25119 Atherosclerotic heart disease of native coronary artery with unspecified angina pectoris: Secondary | ICD-10-CM | POA: Diagnosis not present

## 2020-05-13 DIAGNOSIS — R3981 Functional urinary incontinence: Secondary | ICD-10-CM | POA: Diagnosis not present

## 2020-05-13 DIAGNOSIS — Z6825 Body mass index (BMI) 25.0-25.9, adult: Secondary | ICD-10-CM | POA: Diagnosis not present

## 2020-05-13 DIAGNOSIS — R1319 Other dysphagia: Secondary | ICD-10-CM | POA: Diagnosis not present

## 2020-05-13 DIAGNOSIS — Z9181 History of falling: Secondary | ICD-10-CM | POA: Diagnosis not present

## 2020-05-13 DIAGNOSIS — R2241 Localized swelling, mass and lump, right lower limb: Secondary | ICD-10-CM | POA: Diagnosis not present

## 2020-05-13 DIAGNOSIS — Z0001 Encounter for general adult medical examination with abnormal findings: Secondary | ICD-10-CM | POA: Diagnosis not present

## 2020-05-13 DIAGNOSIS — R32 Unspecified urinary incontinence: Secondary | ICD-10-CM | POA: Diagnosis not present

## 2020-05-13 DIAGNOSIS — E039 Hypothyroidism, unspecified: Secondary | ICD-10-CM | POA: Diagnosis not present

## 2020-05-13 DIAGNOSIS — R918 Other nonspecific abnormal finding of lung field: Secondary | ICD-10-CM | POA: Diagnosis not present

## 2020-05-13 DIAGNOSIS — M79604 Pain in right leg: Secondary | ICD-10-CM | POA: Diagnosis not present

## 2020-05-21 ENCOUNTER — Telehealth: Payer: Self-pay | Admitting: Cardiology

## 2020-05-21 NOTE — Telephone Encounter (Signed)
Spoke with the patient's daughter who states that she would prefer to have a virtual visit with Dr. Radford Pax as she does not feel very comfortable bringing her out a lot of places. Advised that this would be a okay and have set the patient up for a VV in October.  The daughter also states that she recently had some lab work done with her PCP. She was not fasting for the lab work, but she states that cholesterol was only slightly higher than last year and the patient does not want to be back on Crestor. She will have them fax over her lab work to [place] for our records.

## 2020-05-21 NOTE — Telephone Encounter (Signed)
New Message:    Pt is scheduled to see Dr Radford Pax in October. Daughter would like to know if Dr Radford Pax would do a Video Visit with her please?

## 2020-07-14 ENCOUNTER — Telehealth: Payer: Self-pay | Admitting: Cardiology

## 2020-07-14 NOTE — Telephone Encounter (Signed)
Patient's daughter calling to see if we received lab work that she faxed over. Please advise.

## 2020-07-14 NOTE — Telephone Encounter (Signed)
Spoke with the patient's daughter who states that she faxed over the patient's most recent lab work along with BP readings and medication list on 09/15 to [phone].  I have not seen a copy of patient's lab work and do not see that it has been scanned into the chart. Advised that I would double check, but she will go ahead and refax information over to [place].

## 2020-07-17 NOTE — Telephone Encounter (Signed)
Labs were received and scanned into patient's chart.

## 2020-07-23 ENCOUNTER — Telehealth: Payer: Medicare Other | Admitting: Cardiology

## 2020-07-28 ENCOUNTER — Other Ambulatory Visit: Payer: Self-pay | Admitting: Cardiology

## 2020-08-06 ENCOUNTER — Other Ambulatory Visit: Payer: Self-pay | Admitting: Cardiology

## 2020-08-09 ENCOUNTER — Encounter (HOSPITAL_COMMUNITY): Payer: Self-pay | Admitting: Emergency Medicine

## 2020-08-09 ENCOUNTER — Emergency Department (HOSPITAL_COMMUNITY)
Admission: EM | Admit: 2020-08-09 | Discharge: 2020-08-09 | Disposition: A | Discharge status: Home / Self Care | Payer: Medicare Other | Attending: Emergency Medicine | Admitting: Emergency Medicine

## 2020-08-09 ENCOUNTER — Other Ambulatory Visit: Payer: Self-pay

## 2020-08-09 DIAGNOSIS — E039 Hypothyroidism, unspecified: Secondary | ICD-10-CM | POA: Insufficient documentation

## 2020-08-09 DIAGNOSIS — I2510 Atherosclerotic heart disease of native coronary artery without angina pectoris: Secondary | ICD-10-CM | POA: Insufficient documentation

## 2020-08-09 DIAGNOSIS — I10 Essential (primary) hypertension: Secondary | ICD-10-CM

## 2020-08-09 DIAGNOSIS — Z951 Presence of aortocoronary bypass graft: Secondary | ICD-10-CM | POA: Diagnosis not present

## 2020-08-09 DIAGNOSIS — Z79899 Other long term (current) drug therapy: Secondary | ICD-10-CM | POA: Insufficient documentation

## 2020-08-09 DIAGNOSIS — J45909 Unspecified asthma, uncomplicated: Secondary | ICD-10-CM | POA: Insufficient documentation

## 2020-08-09 DIAGNOSIS — R202 Paresthesia of skin: Secondary | ICD-10-CM | POA: Insufficient documentation

## 2020-08-09 DIAGNOSIS — G459 Transient cerebral ischemic attack, unspecified: Secondary | ICD-10-CM | POA: Diagnosis not present

## 2020-08-09 DIAGNOSIS — Z86010 Personal history of colonic polyps: Secondary | ICD-10-CM | POA: Diagnosis not present

## 2020-08-09 DIAGNOSIS — R0902 Hypoxemia: Secondary | ICD-10-CM | POA: Diagnosis not present

## 2020-08-09 DIAGNOSIS — Z7982 Long term (current) use of aspirin: Secondary | ICD-10-CM | POA: Insufficient documentation

## 2020-08-09 DIAGNOSIS — R0689 Other abnormalities of breathing: Secondary | ICD-10-CM | POA: Diagnosis not present

## 2020-08-09 DIAGNOSIS — Z8673 Personal history of transient ischemic attack (TIA), and cerebral infarction without residual deficits: Secondary | ICD-10-CM | POA: Diagnosis not present

## 2020-08-09 LAB — URINALYSIS, ROUTINE W REFLEX MICROSCOPIC
Bacteria, UA: NONE SEEN
Bilirubin Urine: NEGATIVE
Glucose, UA: NEGATIVE mg/dL
Ketones, ur: NEGATIVE mg/dL
Leukocytes,Ua: NEGATIVE
Nitrite: NEGATIVE
Protein, ur: NEGATIVE mg/dL
Specific Gravity, Urine: 1.009 (ref 1.005–1.030)
pH: 5 (ref 5.0–8.0)

## 2020-08-09 LAB — CBC
HCT: 43.3 % (ref 36.0–46.0)
Hemoglobin: 14.4 g/dL (ref 12.0–15.0)
MCH: 33 pg (ref 26.0–34.0)
MCHC: 33.3 g/dL (ref 30.0–36.0)
MCV: 99.1 fL (ref 80.0–100.0)
Platelets: 174 10*3/uL (ref 150–400)
RBC: 4.37 MIL/uL (ref 3.87–5.11)
RDW: 12 % (ref 11.5–15.5)
WBC: 8.4 10*3/uL (ref 4.0–10.5)
nRBC: 0 % (ref 0.0–0.2)

## 2020-08-09 LAB — BASIC METABOLIC PANEL
Anion gap: 9 (ref 5–15)
BUN: 19 mg/dL (ref 8–23)
CO2: 24 mmol/L (ref 22–32)
Calcium: 9.4 mg/dL (ref 8.9–10.3)
Chloride: 107 mmol/L (ref 98–111)
Creatinine, Ser: 0.77 mg/dL (ref 0.44–1.00)
GFR, Estimated: >60 mL/min (ref >60)
Glucose, Bld: 109 mg/dL — ABNORMAL HIGH (ref 70–99)
Potassium: 3.5 mmol/L (ref 3.5–5.1)
Sodium: 140 mmol/L (ref 135–145)

## 2020-08-09 NOTE — ED Triage Notes (Addendum)
[facility] - pt c/o bilateral facial numbness and generalized weakness in her legs that started 0630 today.

## 2020-08-09 NOTE — Discharge Instructions (Addendum)
It was our pleasure to provide your ER care today - we hope that you feel better.  Continue to take an aspirin a day.  Make sure to take your med/blood pressure meds as prescribed.   Your blood pressure is high today - follow up with your docdtor in the next 1-2 weeks.  Return to ER if worse, new symptoms, change in speech or vision, one-sided numbness/weakness, or other concern.

## 2020-08-09 NOTE — ED Provider Notes (Signed)
[facility] EMERGENCY DEPARTMENT Provider Note   CSN: [id number] Arrival date & time: 08/09/20  1916     History Chief Complaint  Patient presents with  . Facial Numbness    Christine Juarez is a 86 y.o. female.  Patient arrives via EMS with concern for possible 'TIA' earlier today. Pt indicates this AM had a circumoral numbness/tingling sensation lasting several minutes. Those symptoms have resolved. No associated headache. No change in vision or speech. No extremity numbness, weakness, or loss of normal function. Gait at baseline (pt indicates walks w walker). No report of trauma or fall. No new meds. EMS notes bp was high. Pt denies headache. No chest pain or sob. No swelling. Pt currently reports feeling at baseline with current new symptoms.   The history is provided by the patient and the EMS personnel.       Past Medical History:  Diagnosis Date  . Aortic stenosis    mild by echo 08/2015  . Arthritis   . CAD (coronary artery disease), native coronary artery    s/p remote CABG  . HTN (hypertension)   . Hyperlipidemia   . Neuropathy   . Thyroid disease     Patient Active Problem List   Diagnosis Date Noted  . Acute respiratory failure with hypoxia ([place]) 03/24/2018  . CAD (coronary artery disease), native coronary artery 03/24/2018  . Closed fracture of left pubis ([place]) 03/23/2018  . Aortic stenosis 08/10/2016  . Diarrhea 05/09/2012  . Rotator cuff syndrome of right shoulder 08/03/2011  . Rotator cuff tear, right 08/03/2011  . TIA 03/12/2008  . HIP PAIN, BILATERAL 03/12/2008  . ASTHMATIC BRONCHITIS, ACUTE 10/25/2007  . PERIPHERAL NEUROPATHY 05/03/2007  . DYSEQUILIBRIUM 05/03/2007  . LEG PAIN 04/14/2007  . ANKLE PAIN, LEFT 03/27/2007  . EDEMA LEG 03/27/2007  . BENIGN POSITIONAL VERTIGO 12/02/2006  . HEADACHE 12/02/2006  . COLONIC POLYPS 10/21/2006  . Hypothyroidism 10/21/2006  . Dyslipidemia 10/21/2006  . COMMON MIGRAINE 10/21/2006  . Essential hypertension  10/21/2006  . ASTHMA 10/21/2006  . IRRITABLE BOWEL SYNDROME 10/21/2006  . FIBROCYSTIC BREAST DISEASE 10/21/2006  . DEGENERATIVE DISC DISEASE 10/21/2006  . LOW BACK PAIN 10/21/2006    Past Surgical History:  Procedure Laterality Date  . APPENDECTOMY    . ARTERIAL BYPASS SURGRY    . BACK SURGERY    . COLONOSCOPY    . LIPOMA EXCISION    . SKULL FRACTURE ELEVATION    . UPPER GASTROINTESTINAL ENDOSCOPY       OB History   No obstetric history on file.     Family History  Problem Relation Age of Onset  . Colon cancer Sister   . Ovarian cancer Sister   . Alzheimer's disease Sister   . Colon cancer Sister   . Hypertension Daughter   . GER disease Daughter   . Anxiety disorder Daughter   . Hyperlipidemia Daughter   . Hypertension Son   . Arthritis Other   . Cancer Other     Social History   Tobacco Use  . Smoking status: Never Smoker  . Smokeless tobacco: Never Used  Vaping Use  . Vaping Use: Never used  Substance Use Topics  . Alcohol use: No  . Drug use: Never    Home Medications Prior to Admission medications   Medication Sig Start Date End Date Taking? Authorizing Provider  aspirin 81 MG chewable tablet Chew 81 mg by mouth daily. Before bed    [provider]  aspirin 81 MG tablet  Take 81 mg by mouth daily.      [provider]  Coenzyme Q10 (COQ10) 100 MG CAPS Take 1 capsule by mouth daily.     [provider]  Cyanocobalamin (VITAMIN B-12 SL) Place under the tongue daily.    [provider]  ezetimibe (ZETIA) 10 MG tablet TAKE 1 TABLET BY MOUTH ONCE DAILY FOR CHOLESTEROL. 07/20/19   Christine Margarita, MD  isosorbide mononitrate (IMDUR) 60 MG 24 hr tablet TAKE 1 TABLET BY MOUTH ONCE DAILY. 08/06/20   Juarez, Christine Hong, MD  NORVASC 5 MG tablet TAKE (1) TABLET BY MOUTH ONCE DAILY. 07/28/20   Christine Margarita, MD  pantoprazole (PROTONIX) 40 MG tablet Take 40 mg by mouth daily.    [provider]  SYNTHROID 88 MCG tablet Take  100 mcg by mouth daily before breakfast.  07/27/17   [provider]    Allergies    Codeine, Dicyclomine hcl, Erythromycin, Lodine [etodolac], Meclizine hcl, Niacin, and Statins  Review of Systems   Review of Systems  Constitutional: Negative for chills and fever.  HENT: Negative for sore throat.   Eyes: Negative for visual disturbance.  Respiratory: Negative for shortness of breath.   Cardiovascular: Negative for chest pain.  Gastrointestinal: Negative for abdominal pain, nausea and vomiting.  Genitourinary: Negative for dysuria and flank pain.  Musculoskeletal: Negative for back pain and neck pain.  Skin: Negative for rash.  Neurological: Negative for speech difficulty, weakness and headaches.  Hematological: Does not bruise/bleed easily.  Psychiatric/Behavioral: Negative for confusion.    Physical Exam Updated Vital Signs Ht 1.575 m (5\' 2" )   Wt 61.2 kg   BMI 24.69 kg/m   Physical Exam Vitals and nursing note reviewed.  Constitutional:      Appearance: Normal appearance. She is well-developed.  HENT:     Head: Atraumatic.     Nose: Nose normal.     Mouth/Throat:     Mouth: Mucous membranes are moist.  Eyes:     General: No scleral icterus.    Conjunctiva/sclera: Conjunctivae normal.     Pupils: Pupils are equal, round, and reactive to light.  Neck:     Vascular: No carotid bruit.     Trachea: No tracheal deviation.  Cardiovascular:     Rate and Rhythm: Normal rate and regular rhythm.     Pulses: Normal pulses.     Heart sounds: Normal heart sounds. No murmur heard.  No friction rub. No gallop.   Pulmonary:     Effort: Pulmonary effort is normal. No respiratory distress.     Breath sounds: Normal breath sounds.  Abdominal:     General: Bowel sounds are normal. There is no distension.     Palpations: Abdomen is soft.     Tenderness: There is no abdominal tenderness.  Genitourinary:    Comments: No cva tenderness.  Musculoskeletal:        General:  No swelling.     Cervical back: Normal range of motion and neck supple. No rigidity. No muscular tenderness.  Skin:    General: Skin is warm and dry.     Findings: No rash.  Neurological:     General: No focal deficit present.     Mental Status: She is alert.     Cranial Nerves: No cranial nerve deficit.     Comments: Alert, speech normal. No aphasia or dysarthria. Motor/sens grossly intact bil. Ambulates from EMS stretcher to ED stretcher - states gait at baseline.  Psychiatric:        Mood and Affect: Mood normal.     ED Results / Procedures / Treatments   Labs (all labs ordered are listed, but only abnormal results are displayed) Results for orders placed or performed during the hospital encounter of 56/31/49  Basic metabolic panel  Result Value Ref Range   Sodium 140 135 - 145 mmol/L   Potassium 3.5 3.5 - 5.1 mmol/L   Chloride 107 98 - 111 mmol/L   CO2 24 22 - 32 mmol/L   Glucose, Bld 109 (H) 70 - 99 mg/dL   BUN 19 8 - 23 mg/dL   Creatinine, Ser 0.77 0.44 - 1.00 mg/dL   Calcium 9.4 8.9 - 10.3 mg/dL   GFR, Estimated >60 >60 mL/min   Anion gap 9 5 - 15  CBC  Result Value Ref Range   WBC 8.4 4.0 - 10.5 K/uL   RBC 4.37 3.87 - 5.11 MIL/uL   Hemoglobin 14.4 12.0 - 15.0 g/dL   HCT 43.3 36 - 46 %   MCV 99.1 80.0 - 100.0 fL   MCH 33.0 26.0 - 34.0 pg   MCHC 33.3 30.0 - 36.0 g/dL   RDW 12.0 11.5 - 15.5 %   Platelets 174 150 - 400 K/uL   nRBC 0.0 0.0 - 0.2 %  Urinalysis, Routine w reflex microscopic  Result Value Ref Range   Color, Urine YELLOW YELLOW   APPearance CLEAR CLEAR   Specific Gravity, Urine 1.009 1.005 - 1.030   pH 5.0 5.0 - 8.0   Glucose, UA NEGATIVE NEGATIVE mg/dL   Hgb urine dipstick SMALL (A) NEGATIVE   Bilirubin Urine NEGATIVE NEGATIVE   Ketones, ur NEGATIVE NEGATIVE mg/dL   Protein, ur NEGATIVE NEGATIVE mg/dL   Nitrite NEGATIVE NEGATIVE   Leukocytes,Ua NEGATIVE NEGATIVE   RBC / HPF 0-5 0 - 5 RBC/hpf   WBC, UA 0-5 0 - 5 WBC/hpf   Bacteria, UA NONE  SEEN NONE SEEN   Squamous Epithelial / LPF 0-5 0 - 5    EKG EKG Interpretation  Date/Time:  Saturday August 09 2020 19:31:47 EDT Ventricular Rate:  73 PR Interval:    QRS Duration: 109 QT Interval:  427 QTC Calculation: 471 R Axis:   99 Text Interpretation: Sinus rhythm Borderline prolonged PR interval No significant change since last tracing Confirmed by Lajean Saver [phone]) on 08/09/2020 7:39:19 PM   Radiology No results found.  Procedures Procedures (including critical care time)  Medications Ordered in ED Medications - No data to display  ED Course  I have reviewed the triage vital signs and the nursing notes.  Pertinent labs & imaging results that were available during my care of the patient were reviewed by me and considered in my medical decision making (see chart for details).    MDM Rules/Calculators/A&P                          Labs sent.   Reviewed nursing notes and prior charts for additional history.   Labs reviewed/interpreted by me - wbc normal, hgb normal, chem normal.   Recheck bp, improved from prior. Family indicates patient sometimes misses doses of her bp meds.  Recheck pt, remains asymptomatic, no numbness, no pain, states feels fine.   Pt appears stable for d/c.   Rec pcp f/u.  Return precautions provided.     Final Clinical Impression(s) / ED Diagnoses Final diagnoses:  None    Rx / DC  Orders ED Discharge Orders    None       Lajean Saver, MD 08/09/20 2104

## 2020-08-11 ENCOUNTER — Telehealth: Payer: Self-pay | Admitting: Cardiology

## 2020-08-11 NOTE — Telephone Encounter (Signed)
° ° ° °  Christine Juarez would like to speak with Dr. Theodosia Blender nurse

## 2020-08-11 NOTE — Telephone Encounter (Signed)
Spoke with the patient's daughter who states that the patient had an episode on Saturday and they were concerned about a stroke and called EMS. Patient had numbness in tingling in her face and BP was elevated. The daughter reports that she was reading over the ED notes and it indicated that her numbness/tingling has resolved. However, the daughter states that the patient is still having numbness/tingling in her face and is wondering what can be done to help with that. She also reports that she is having some pain in her teeth.  Patient otherwise feels fine with no new symptoms. BP this morning was 135/64 and HR 66. I have advised the patient's daughter to contact her PCP and on return precautions to the ED. She verbalized understanding and will try to contact the patient's PCP again.

## 2020-08-14 ENCOUNTER — Other Ambulatory Visit: Payer: Self-pay | Admitting: Cardiology

## 2020-08-14 ENCOUNTER — Other Ambulatory Visit (HOSPITAL_COMMUNITY): Payer: Self-pay | Admitting: Internal Medicine

## 2020-08-14 DIAGNOSIS — R29810 Facial weakness: Secondary | ICD-10-CM

## 2020-08-14 DIAGNOSIS — R4781 Slurred speech: Secondary | ICD-10-CM

## 2020-08-21 ENCOUNTER — Telehealth: Payer: Medicare Other | Admitting: Cardiology

## 2020-08-22 ENCOUNTER — Ambulatory Visit (HOSPITAL_COMMUNITY)
Admission: RE | Admit: 2020-08-22 | Discharge: 2020-08-22 | Disposition: A | Discharge status: Home / Self Care | Payer: Medicare Other | Source: Ambulatory Visit | Attending: Internal Medicine | Admitting: Internal Medicine

## 2020-08-22 ENCOUNTER — Other Ambulatory Visit: Payer: Self-pay

## 2020-08-22 DIAGNOSIS — R29810 Facial weakness: Secondary | ICD-10-CM

## 2020-08-22 DIAGNOSIS — R4781 Slurred speech: Secondary | ICD-10-CM | POA: Diagnosis not present

## 2020-08-22 DIAGNOSIS — R262 Difficulty in walking, not elsewhere classified: Secondary | ICD-10-CM | POA: Diagnosis not present

## 2020-08-22 DIAGNOSIS — I739 Peripheral vascular disease, unspecified: Secondary | ICD-10-CM | POA: Diagnosis not present

## 2020-08-22 DIAGNOSIS — R200 Anesthesia of skin: Secondary | ICD-10-CM | POA: Diagnosis not present

## 2020-09-09 ENCOUNTER — Other Ambulatory Visit: Payer: Self-pay

## 2020-09-09 ENCOUNTER — Telehealth (INDEPENDENT_AMBULATORY_CARE_PROVIDER_SITE_OTHER): Payer: Medicare Other | Admitting: Cardiology

## 2020-09-09 VITALS — BP 139/81 | HR 71 | Ht 60.0 in | Wt 127.6 lb

## 2020-09-09 DIAGNOSIS — I10 Essential (primary) hypertension: Secondary | ICD-10-CM | POA: Diagnosis not present

## 2020-09-09 DIAGNOSIS — I2510 Atherosclerotic heart disease of native coronary artery without angina pectoris: Secondary | ICD-10-CM | POA: Diagnosis not present

## 2020-09-09 DIAGNOSIS — E785 Hyperlipidemia, unspecified: Secondary | ICD-10-CM

## 2020-09-09 DIAGNOSIS — I350 Nonrheumatic aortic (valve) stenosis: Secondary | ICD-10-CM | POA: Diagnosis not present

## 2020-09-09 NOTE — Progress Notes (Addendum)
Virtual Visit via Video Note   This visit type was conducted due to national recommendations for restrictions regarding the COVID-19 Pandemic (e.g. social distancing) in an effort to limit this patient's exposure and mitigate transmission in our community.  Due to her co-morbid illnesses, this patient is at least at moderate risk for complications without adequate follow up.  This format is felt to be most appropriate for this patient at this time.  All issues noted in this document were discussed and addressed.  A limited physical exam was performed with this format.  Please refer to the patient's chart for her consent to telehealth for [facility].   Evaluation Performed:  Follow-up visit  This visit type was conducted due to national recommendations for restrictions regarding the COVID-19 Pandemic (e.g. social distancing).  This format is felt to be most appropriate for this patient at this time.  All issues noted in this document were discussed and addressed.  No physical exam was performed (except for noted visual exam findings with Video Visits).  Please refer to the patient's chart (MyChart message for video visits and phone note for telephone visits) for the patient's consent to telehealth for [facility].  Date:  09/19/2020   ID:  Christine Juarez, DOB 01/11/1920, MRN [id number]  Patient Location:  Home  Provider location:   [place]  PCP:  Celene Squibb, MD  Cardiologist:  Fransico Him, MD  Electrophysiologist:  None   Chief Complaint:  CAD, HTN, HLD  History of Present Illness:    Christine Juarez is a 86 y.o. female who presents via audio/video conferencing for a telehealth visit today.    Christine Juarez is a 86 y.o. female with a hx of ASCADs/p remote CABG, HTN, and dyslipidemia.  Since I saw her last she was in the hospital in October with TIA sx and workup was negative.  She continues to have problems with numbness in mouth and teeth.  Her head CT showed no acute  infarct.  She is here today for followup and is doing well.  She denies any chest pain or pressure, SOB, DOE, PND, orthopnea, LE edema, dizziness, palpitations or syncope. SHe is compliant with her meds and is tolerating meds with no SE.    The patient does not have symptoms concerning for COVID-19 infection (fever, chills, cough, or new shortness of breath).    Prior CV studies:   The following studies were reviewed today:  none  Past Medical History:  Diagnosis Date  . Aortic stenosis    mild by echo 08/2015  . Arthritis   . CAD (coronary artery disease), native coronary artery    s/p remote CABG  . HTN (hypertension)   . Hyperlipidemia   . Neuropathy   . Thyroid disease    Past Surgical History:  Procedure Laterality Date  . APPENDECTOMY    . ARTERIAL BYPASS SURGRY    . BACK SURGERY    . COLONOSCOPY    . LIPOMA EXCISION    . SKULL FRACTURE ELEVATION    . UPPER GASTROINTESTINAL ENDOSCOPY       Current Meds  Medication Sig  . acetaminophen (TYLENOL) 500 MG tablet Take 1,000 mg by mouth every 6 (six) hours as needed.  Marland Kitchen aspirin 81 MG chewable tablet Chew 81 mg by mouth daily. Before bed  . aspirin 81 MG tablet Take 81 mg by mouth daily.    . Coenzyme Q10 (COQ10) 100 MG CAPS Take 1 capsule by mouth daily.   Marland Kitchen  Cyanocobalamin (VITAMIN B-12 SL) Place under the tongue daily.  . isosorbide mononitrate (IMDUR) 60 MG 24 hr tablet TAKE 1 TABLET BY MOUTH ONCE DAILY.  . NORVASC 5 MG tablet TAKE (1) TABLET BY MOUTH ONCE DAILY.  . pantoprazole (PROTONIX) 40 MG tablet Take 40 mg by mouth daily.  Marland Kitchen SYNTHROID 100 MCG tablet Take 100 mcg by mouth daily.  . [DISCONTINUED] ezetimibe (ZETIA) 10 MG tablet TAKE 1 TABLET BY MOUTH ONCE DAILY FOR CHOLESTEROL.     Allergies:   Codeine, Dicyclomine hcl, Erythromycin, Lodine [etodolac], Meclizine hcl, Niacin, and Statins   Social History   Tobacco Use  . Smoking status: Never Smoker  . Smokeless tobacco: Never Used  Vaping Use  . Vaping  Use: Never used  Substance Use Topics  . Alcohol use: No  . Drug use: Never     Family Hx: The patient's family history includes Anxiety disorder in her daughter; Arthritis in an other family member; Cancer in an other family member; Colon cancer in her brother and sister; Dementia in her sister; GER disease in her daughter; Hyperlipidemia in her daughter; Hypertension in her son; Ovarian cancer in her sister.  ROS:   Please see the history of present illness.     All other systems reviewed and are negative.   Labs/Other Tests and Data Reviewed:    Recent Labs: 08/09/2020: BUN 19; Creatinine, Ser 0.77; Hemoglobin 14.4; Platelets 174; Potassium 3.5; Sodium 140   Recent Lipid Panel Lab Results  Component Value Date/Time   CHOL 160 01/27/2015 10:33 AM   TRIG 105.0 01/27/2015 10:33 AM   HDL 56.90 01/27/2015 10:33 AM   CHOLHDL 3 01/27/2015 10:33 AM   LDLCALC 82 01/27/2015 10:33 AM    Wt Readings from Last 3 Encounters:  09/09/20 127 lb 9.6 oz (57.9 kg)  08/09/20 135 lb (61.2 kg)  07/20/19 131 lb (59.4 kg)     Objective:    Vital Signs:  BP 139/81 Comment: left arm 153 88 after hair appt  Pulse 71   Ht 5' (1.524 m)   Wt 127 lb 9.6 oz (57.9 kg)   BMI 24.92 kg/m    ASSESSMENT & PLAN:    1.  ASCAD -s/p remote CABG -she has not had any anginal symptoms since I saw her last -continue ASA and Imdur -statin intolerant  2.  HTN -BP is good today -continue on amlodipine 5mg  daily  3.  Aortic stenosis -mild by echo 2017 with mean AVG 57mmHg -given advanced age will not pursue repeat echo as not a candidate for invasive procedures due to advanced age and asymptomatic  4.  HLD -LDL goal < 70 -she is statin intolerant and due to advanced age [id number] not pursued.  COVID-19 Education: The signs and symptoms of COVID-19 were discussed with the patient and how to seek care for testing (follow up with PCP or arrange E-visit).  The importance of social distancing was discussed  today.  Patient Risk:   After full review of this patient's clinical status, I feel that they are at least moderate risk at this time.  Time:   Today, I have spent 20 minutes on telemedicine discussing medical problems including CAD, HTN, HLD and reviewing patient's chart including labs.  Medication Adjustments/Labs and Tests Ordered: Current medicines are reviewed at length with the patient today.  Concerns regarding medicines are outlined above.  Tests Ordered: No orders of the defined types were placed in this encounter.  Medication Changes: No orders of the defined  types were placed in this encounter.   Disposition:  Follow up in 1 year(s)  Signed, Fransico Him, MD  09/19/2020 7:20 PM    [place]

## 2020-09-17 ENCOUNTER — Other Ambulatory Visit: Payer: Self-pay | Admitting: Cardiology

## 2020-09-19 ENCOUNTER — Encounter: Payer: Self-pay | Admitting: Cardiology

## 2020-09-19 ENCOUNTER — Telehealth: Payer: Self-pay | Admitting: Cardiology

## 2020-09-19 NOTE — Telephone Encounter (Signed)
Spoke with the patient's daughter in regards to concerns of errors in the patient's last office visit note. I have corrected the patient's medication list and family history in her chart. She also states that the note has recent lipid panel listed from 2016. She states that the patient had lab work done this year 04/2020 and wanted to make sure Dr. Radford Pax was aware. I advised her that Dr. Radford Pax was aware of her most recent lab work and that the patient is statin intolerant and due to her age Dr. Radford Pax did not think other treatment options were needed. The daughter verbalized understanding.

## 2020-09-19 NOTE — Telephone Encounter (Signed)
Patient's daughter, Leda Gauze, is requesting to make several modifications to patient's notes from telemedicine appointment on 09/09/20 with Dr. Radford Pax. She states she recently printed out her office notes and she has composed a list of corrections that are as follows:   - under "h/o illness" it reads "Kirin S Powellis a 86 y.o." - patient is 86 y/o.  - in patient's medication list, there is a note stating that the patient is taking NORVASC 5 MG tablet differently - patient's daughter states this is not accurate.  - under "family hx" it reads that the patient's "sister and sister.." have colon cancer - this should read, "sister and brother"  - patient's daughter states there is also documentation stating that the patient has Alzheimers - per daughter, this is not accurate. The patient has Dementia.  -patient's daughter states there is documentation stating that the patient's son and daughter Leda Gauze) have hypertension. She states this is incorrect as she had not had hypertension in 8 years.  She states she will provide additional corrections when she receives a call back.

## 2020-09-19 NOTE — Telephone Encounter (Signed)
All changes made except for the hx of HTN in patient's son and daughter as any hx whether present or in the past of HTN is still consider a hx of that dx

## 2020-11-08 ENCOUNTER — Other Ambulatory Visit: Payer: Self-pay | Admitting: Cardiology

## 2021-01-22 ENCOUNTER — Other Ambulatory Visit: Payer: Self-pay | Admitting: Cardiology

## 2021-01-27 DIAGNOSIS — E039 Hypothyroidism, unspecified: Secondary | ICD-10-CM | POA: Diagnosis not present

## 2021-01-27 DIAGNOSIS — G9009 Other idiopathic peripheral autonomic neuropathy: Secondary | ICD-10-CM | POA: Diagnosis not present

## 2021-01-27 DIAGNOSIS — R2241 Localized swelling, mass and lump, right lower limb: Secondary | ICD-10-CM | POA: Diagnosis not present

## 2021-01-27 DIAGNOSIS — I350 Nonrheumatic aortic (valve) stenosis: Secondary | ICD-10-CM | POA: Diagnosis not present

## 2021-01-27 DIAGNOSIS — J84115 Respiratory bronchiolitis interstitial lung disease: Secondary | ICD-10-CM | POA: Diagnosis not present

## 2021-01-27 DIAGNOSIS — M79606 Pain in leg, unspecified: Secondary | ICD-10-CM | POA: Diagnosis not present

## 2021-01-27 DIAGNOSIS — R7301 Impaired fasting glucose: Secondary | ICD-10-CM | POA: Diagnosis not present

## 2021-01-27 DIAGNOSIS — Z6825 Body mass index (BMI) 25.0-25.9, adult: Secondary | ICD-10-CM | POA: Diagnosis not present

## 2021-01-27 DIAGNOSIS — I25119 Atherosclerotic heart disease of native coronary artery with unspecified angina pectoris: Secondary | ICD-10-CM | POA: Diagnosis not present

## 2021-01-27 DIAGNOSIS — G629 Polyneuropathy, unspecified: Secondary | ICD-10-CM | POA: Diagnosis not present

## 2021-01-27 DIAGNOSIS — M545 Low back pain, unspecified: Secondary | ICD-10-CM | POA: Diagnosis not present

## 2021-01-27 DIAGNOSIS — R918 Other nonspecific abnormal finding of lung field: Secondary | ICD-10-CM | POA: Diagnosis not present

## 2021-01-27 DIAGNOSIS — R32 Unspecified urinary incontinence: Secondary | ICD-10-CM | POA: Diagnosis not present

## 2021-01-27 DIAGNOSIS — E782 Mixed hyperlipidemia: Secondary | ICD-10-CM | POA: Diagnosis not present

## 2021-01-27 DIAGNOSIS — R1319 Other dysphagia: Secondary | ICD-10-CM | POA: Diagnosis not present

## 2021-01-27 DIAGNOSIS — M79604 Pain in right leg: Secondary | ICD-10-CM | POA: Diagnosis not present

## 2021-01-27 DIAGNOSIS — Z9181 History of falling: Secondary | ICD-10-CM | POA: Diagnosis not present

## 2021-01-27 DIAGNOSIS — E8809 Other disorders of plasma-protein metabolism, not elsewhere classified: Secondary | ICD-10-CM | POA: Diagnosis not present

## 2021-01-27 DIAGNOSIS — I10 Essential (primary) hypertension: Secondary | ICD-10-CM | POA: Diagnosis not present

## 2021-01-27 DIAGNOSIS — R3981 Functional urinary incontinence: Secondary | ICD-10-CM | POA: Diagnosis not present

## 2021-01-27 DIAGNOSIS — I2510 Atherosclerotic heart disease of native coronary artery without angina pectoris: Secondary | ICD-10-CM | POA: Diagnosis not present

## 2021-04-08 DIAGNOSIS — E039 Hypothyroidism, unspecified: Secondary | ICD-10-CM | POA: Diagnosis not present

## 2021-05-02 ENCOUNTER — Ambulatory Visit
Admission: EM | Admit: 2021-05-02 | Discharge: 2021-05-02 | Disposition: A | Discharge status: Home / Self Care | Payer: Medicare Other | Attending: Emergency Medicine | Admitting: Emergency Medicine

## 2021-05-02 ENCOUNTER — Encounter: Payer: Self-pay | Admitting: Emergency Medicine

## 2021-05-02 DIAGNOSIS — R300 Dysuria: Secondary | ICD-10-CM | POA: Diagnosis not present

## 2021-05-02 NOTE — ED Triage Notes (Addendum)
Pt has had some confusion on and off x 1 week.  Pt took at home UTI test that was +

## 2021-05-02 NOTE — ED Notes (Signed)
Pt unable to urinate. Specimen cup sent home with pt

## 2021-05-03 ENCOUNTER — Telehealth: Payer: Self-pay | Admitting: Family Medicine

## 2021-05-03 MED ORDER — CEPHALEXIN 500 MG PO CAPS
500.0000 mg | ORAL_CAPSULE | Freq: Two times a day (BID) | ORAL | 0 refills | Status: AC
Start: 2021-05-03 — End: 2021-05-13

## 2021-05-03 NOTE — Telephone Encounter (Signed)
Keflex sent over for patient seen yesterday . Daughter returned urine specimen as patient was unable to urinate yesterday.

## 2021-05-04 DIAGNOSIS — Z8673 Personal history of transient ischemic attack (TIA), and cerebral infarction without residual deficits: Secondary | ICD-10-CM | POA: Diagnosis not present

## 2021-05-04 DIAGNOSIS — R410 Disorientation, unspecified: Secondary | ICD-10-CM | POA: Diagnosis not present

## 2021-05-04 DIAGNOSIS — E860 Dehydration: Secondary | ICD-10-CM | POA: Diagnosis not present

## 2021-05-04 DIAGNOSIS — B029 Zoster without complications: Secondary | ICD-10-CM | POA: Diagnosis not present

## 2021-05-04 DIAGNOSIS — N390 Urinary tract infection, site not specified: Secondary | ICD-10-CM | POA: Diagnosis not present

## 2021-05-04 LAB — URINE CULTURE
Culture: <10000 — AB
Special Requests: NORMAL

## 2021-05-05 ENCOUNTER — Other Ambulatory Visit: Payer: Self-pay | Admitting: Family Medicine

## 2021-05-05 ENCOUNTER — Other Ambulatory Visit (HOSPITAL_COMMUNITY): Payer: Self-pay | Admitting: Family Medicine

## 2021-05-05 DIAGNOSIS — R410 Disorientation, unspecified: Secondary | ICD-10-CM

## 2021-05-07 ENCOUNTER — Emergency Department (HOSPITAL_COMMUNITY): Payer: Medicare Other

## 2021-05-07 ENCOUNTER — Encounter (HOSPITAL_COMMUNITY): Payer: Self-pay | Admitting: *Deleted

## 2021-05-07 ENCOUNTER — Emergency Department (HOSPITAL_COMMUNITY)
Admission: EM | Admit: 2021-05-07 | Discharge: 2021-05-07 | Disposition: A | Discharge status: Home / Self Care | Payer: Medicare Other | Attending: Emergency Medicine | Admitting: Emergency Medicine

## 2021-05-07 ENCOUNTER — Other Ambulatory Visit: Payer: Self-pay

## 2021-05-07 DIAGNOSIS — Z79899 Other long term (current) drug therapy: Secondary | ICD-10-CM | POA: Insufficient documentation

## 2021-05-07 DIAGNOSIS — I10 Essential (primary) hypertension: Secondary | ICD-10-CM | POA: Insufficient documentation

## 2021-05-07 DIAGNOSIS — I2510 Atherosclerotic heart disease of native coronary artery without angina pectoris: Secondary | ICD-10-CM | POA: Insufficient documentation

## 2021-05-07 DIAGNOSIS — E039 Hypothyroidism, unspecified: Secondary | ICD-10-CM | POA: Insufficient documentation

## 2021-05-07 DIAGNOSIS — Z8619 Personal history of other infectious and parasitic diseases: Secondary | ICD-10-CM | POA: Insufficient documentation

## 2021-05-07 DIAGNOSIS — Z7982 Long term (current) use of aspirin: Secondary | ICD-10-CM | POA: Diagnosis not present

## 2021-05-07 DIAGNOSIS — R410 Disorientation, unspecified: Secondary | ICD-10-CM | POA: Diagnosis not present

## 2021-05-07 DIAGNOSIS — R4182 Altered mental status, unspecified: Secondary | ICD-10-CM | POA: Insufficient documentation

## 2021-05-07 DIAGNOSIS — J45909 Unspecified asthma, uncomplicated: Secondary | ICD-10-CM | POA: Insufficient documentation

## 2021-05-07 LAB — CBC WITH DIFFERENTIAL/PLATELET
Abs Immature Granulocytes: 0.09 10*3/uL — ABNORMAL HIGH (ref 0.00–0.07)
Basophils Absolute: 0.1 10*3/uL (ref 0.0–0.1)
Basophils Relative: 1 %
Eosinophils Absolute: 0.1 10*3/uL (ref 0.0–0.5)
Eosinophils Relative: 1 %
HCT: 47.9 % — ABNORMAL HIGH (ref 36.0–46.0)
Hemoglobin: 16.8 g/dL — ABNORMAL HIGH (ref 12.0–15.0)
Immature Granulocytes: 1 %
Lymphocytes Relative: 16 %
Lymphs Abs: 2.4 10*3/uL (ref 0.7–4.0)
MCH: 33.1 pg (ref 26.0–34.0)
MCHC: 35.1 g/dL (ref 30.0–36.0)
MCV: 94.5 fL (ref 80.0–100.0)
Monocytes Absolute: 1.2 10*3/uL — ABNORMAL HIGH (ref 0.1–1.0)
Monocytes Relative: 8 %
Neutro Abs: 11.1 10*3/uL — ABNORMAL HIGH (ref 1.7–7.7)
Neutrophils Relative %: 73 %
Platelets: 291 10*3/uL (ref 150–400)
RBC: 5.07 MIL/uL (ref 3.87–5.11)
RDW: 11.9 % (ref 11.5–15.5)
WBC: 14.9 10*3/uL — ABNORMAL HIGH (ref 4.0–10.5)
nRBC: 0 % (ref 0.0–0.2)

## 2021-05-07 LAB — COMPREHENSIVE METABOLIC PANEL
ALT: 17 U/L (ref 0–44)
AST: 16 U/L (ref 15–41)
Albumin: 3.9 g/dL (ref 3.5–5.0)
Alkaline Phosphatase: 108 U/L (ref 38–126)
Anion gap: 10 (ref 5–15)
BUN: 19 mg/dL (ref 8–23)
CO2: 26 mmol/L (ref 22–32)
Calcium: 9.4 mg/dL (ref 8.9–10.3)
Chloride: 97 mmol/L — ABNORMAL LOW (ref 98–111)
Creatinine, Ser: 0.69 mg/dL (ref 0.44–1.00)
GFR, Estimated: >60 mL/min (ref >60)
Glucose, Bld: 99 mg/dL (ref 70–99)
Potassium: 3.5 mmol/L (ref 3.5–5.1)
Sodium: 133 mmol/L — ABNORMAL LOW (ref 135–145)
Total Bilirubin: 0.8 mg/dL (ref 0.3–1.2)
Total Protein: 7.3 g/dL (ref 6.5–8.1)

## 2021-05-07 LAB — URINALYSIS, ROUTINE W REFLEX MICROSCOPIC
Bacteria, UA: NONE SEEN
Bilirubin Urine: NEGATIVE
Glucose, UA: NEGATIVE mg/dL
Ketones, ur: NEGATIVE mg/dL
Nitrite: NEGATIVE
Protein, ur: NEGATIVE mg/dL
Specific Gravity, Urine: 1.018 (ref 1.005–1.030)
pH: 5 (ref 5.0–8.0)

## 2021-05-07 MED ORDER — SODIUM CHLORIDE 0.9 % IV BOLUS
500.0000 mL | Freq: Once | INTRAVENOUS | Status: AC
  Administered 2021-05-07: 500 mL via INTRAVENOUS

## 2021-05-07 NOTE — ED Provider Notes (Signed)
[facility] EMERGENCY DEPARTMENT Provider Note   CSN: [id number] Arrival date & time: 05/07/21  1728     History Chief Complaint  Patient presents with   Altered Mental Status    Christine Juarez is a 87 y.o. female.  Pt presents to the ED today with AMS.  Pt has been altered for a few days.  Her daughter took her to UC to get a urine sample, but that was nl.  Pt has had shingles for 3 weeks.  The shingles are finally drying up.  Her daughter has been applying a cream (ingredients below) to her rash that was prescribed by her pcp.  She stopped applying it yesterday because she was worried the cream was causing the sx. Pt has been falling asleep easily and has not been eating or drinking much.  Pt has not been vaccinated.  Daughter refuses even a test.       Past Medical History:  Diagnosis Date   Aortic stenosis    mild by echo 08/2015   Arthritis    CAD (coronary artery disease), native coronary artery    s/p remote CABG   HTN (hypertension)    Hyperlipidemia    Neuropathy    Thyroid disease     Patient Active Problem List   Diagnosis Date Noted   Acute respiratory failure with hypoxia ([place]) 03/24/2018   CAD (coronary artery disease), native coronary artery 03/24/2018   Closed fracture of left pubis ([place]) 03/23/2018   Aortic stenosis 08/10/2016   Diarrhea 05/09/2012   Rotator cuff syndrome of right shoulder 08/03/2011   Rotator cuff tear, right 08/03/2011   TIA 03/12/2008   HIP PAIN, BILATERAL 03/12/2008   ASTHMATIC BRONCHITIS, ACUTE 10/25/2007   PERIPHERAL NEUROPATHY 05/03/2007   DYSEQUILIBRIUM 05/03/2007   LEG PAIN 04/14/2007   ANKLE PAIN, LEFT 03/27/2007   EDEMA LEG 03/27/2007   BENIGN POSITIONAL VERTIGO 12/02/2006   HEADACHE 12/02/2006   COLONIC POLYPS 10/21/2006   Hypothyroidism 10/21/2006   Dyslipidemia 10/21/2006   COMMON MIGRAINE 10/21/2006   Essential hypertension 10/21/2006   ASTHMA 10/21/2006   IRRITABLE BOWEL SYNDROME 10/21/2006   FIBROCYSTIC  BREAST DISEASE 10/21/2006   DEGENERATIVE DISC DISEASE 10/21/2006   LOW BACK PAIN 10/21/2006    Past Surgical History:  Procedure Laterality Date   APPENDECTOMY     ARTERIAL BYPASS SURGRY     BACK SURGERY     COLONOSCOPY     LIPOMA EXCISION     SKULL FRACTURE ELEVATION     UPPER GASTROINTESTINAL ENDOSCOPY       OB History   No obstetric history on file.     Family History  Problem Relation Age of Onset   Ovarian cancer Sister    Colon cancer Sister    Dementia Sister    GER disease Daughter    Anxiety disorder Daughter    Hyperlipidemia Daughter    Hypertension Son    Arthritis Other    Cancer Other    Colon cancer Brother     Social History   Tobacco Use   Smoking status: Never   Smokeless tobacco: Never  Vaping Use   Vaping Use: Never used  Substance Use Topics   Alcohol use: No   Drug use: Never    Home Medications Prior to Admission medications   Medication Sig Start Date End Date Taking? Authorizing Provider  acetaminophen (TYLENOL) 500 MG tablet Take 1,000 mg by mouth every 6 (six) hours as needed.    [provider]  aspirin 81 MG chewable tablet Chew 81 mg by mouth daily. Before bed    [provider]  aspirin 81 MG tablet Take 81 mg by mouth daily.      [provider]  cephALEXin (KEFLEX) 500 MG capsule Take 1 capsule (500 mg total) by mouth 2 (two) times daily for 10 days. 05/03/21 05/13/21  Scot Jun, FNP  Coenzyme Q10 (COQ10) 100 MG CAPS Take 1 capsule by mouth daily.     [provider]  Cyanocobalamin (VITAMIN B-12 SL) Place under the tongue daily.    [provider]  ezetimibe (ZETIA) 10 MG tablet TAKE 1 TABLET BY MOUTH ONCE DAILY FOR CHOLESTEROL. 09/17/20   [name], MD  isosorbide mononitrate (IMDUR) 60 MG 24 hr tablet TAKE 1 TABLET BY MOUTH ONCE DAILY. 11/10/20   [name], MD  NORVASC 5 MG tablet TAKE (1) TABLET BY MOUTH ONCE DAILY. 01/22/21   [name], MD  pantoprazole  (PROTONIX) 40 MG tablet Take 40 mg by mouth daily.    [provider]  SYNTHROID 100 MCG tablet Take 100 mcg by mouth daily. 06/27/20   [provider]  SYNTHROID 88 MCG tablet Take 88 mcg by mouth daily. 04/30/21   [provider]    Allergies    Codeine, Dicyclomine hcl, Erythromycin, Lodine [etodolac], Meclizine hcl, Niacin, and Statins  Review of Systems   Review of Systems  Neurological:  Positive for weakness.  All other systems reviewed and are negative.  Physical Exam Updated Vital Signs BP (!) 150/80   Pulse 66   Temp (!) 97.5 F (36.4 C)   Resp (!) 23   SpO2 94%   Physical Exam Vitals and nursing note reviewed.  Constitutional:      Appearance: Normal appearance.  HENT:     Head: Normocephalic and atraumatic.     Right Ear: External ear normal.     Left Ear: External ear normal.     Nose: Nose normal.     Mouth/Throat:     Mouth: Mucous membranes are moist.     Pharynx: Oropharynx is clear.  Eyes:     Extraocular Movements: Extraocular movements intact.     Conjunctiva/sclera: Conjunctivae normal.     Pupils: Pupils are equal, round, and reactive to light.  Cardiovascular:     Rate and Rhythm: Normal rate and regular rhythm.     Pulses: Normal pulses.     Heart sounds: Normal heart sounds.  Pulmonary:     Effort: Pulmonary effort is normal.     Breath sounds: Normal breath sounds.  Abdominal:     General: Abdomen is flat. Bowel sounds are normal.     Palpations: Abdomen is soft.  Musculoskeletal:        General: Normal range of motion.     Cervical back: Normal range of motion and neck supple.  Skin:    Capillary Refill: Capillary refill takes less than 2 seconds.     Comments: Healing shingles to left back, flank, chest  Neurological:     General: No focal deficit present.     Mental Status: She is alert and oriented to person, place, and time.  Psychiatric:        Mood and Affect: Mood normal.        Behavior: Behavior  normal.    ED Results / Procedures / Treatments   Labs (all labs ordered are listed, but only abnormal results are displayed) Labs Reviewed  CBC WITH DIFFERENTIAL/PLATELET - Abnormal; Notable for the following components:      Result Value   WBC 14.9 (*)    Hemoglobin 16.8 (*)    HCT 47.9 (*)    Neutro Abs 11.1 (*)    Monocytes Absolute 1.2 (*)    Abs Immature Granulocytes 0.09 (*)    All other components within normal limits  COMPREHENSIVE METABOLIC PANEL - Abnormal; Notable for the following components:   Sodium 133 (*)    Chloride 97 (*)    All other components within normal limits  URINALYSIS, ROUTINE W REFLEX MICROSCOPIC - Abnormal; Notable for the following components:   Color, Urine AMBER (*)    Hgb urine dipstick SMALL (*)    Leukocytes,Ua SMALL (*)    All other components within normal limits  CBG MONITORING, ED    EKG EKG Interpretation  Date/Time:  Thursday May 07 2021 19:05:02 EDT Ventricular Rate:  71 PR Interval:  235 QRS Duration: 106 QT Interval:  369 QTC Calculation: 401 R Axis:   89 Text Interpretation: Sinus rhythm Prolonged PR interval Borderline right axis deviation Anteroseptal infarct, old Borderline repolarization abnormality No significant change since last tracing Confirmed by Isla Pence [phone]) on 05/07/2021 8:00:55 PM  Radiology DG Chest Portable 1 View  Result Date: 05/07/2021 CLINICAL DATA:  Altered level of consciousness, confusion, shingles EXAM: PORTABLE CHEST 1 VIEW COMPARISON:  03/23/2018 FINDINGS: Single frontal view of the chest demonstrates an unremarkable cardiac silhouette. Stable postsurgical changes from CABG. No acute airspace disease, effusion, or pneumothorax. No acute bony abnormality. IMPRESSION: 1. No acute intrathoracic process. Electronically Signed   By: Randa Ngo M.D.   On: 05/07/2021 19:34    Procedures Procedures   Medications Ordered in ED Medications  sodium chloride 0.9 % bolus 500 mL (0 mLs  Intravenous Stopped 05/07/21 1956)    ED Course  I have reviewed the triage vital signs and the nursing notes.  Pertinent labs & imaging results that were available during my care of the patient were reviewed by me and considered in my medical decision making (see chart for details).    MDM Rules/Calculators/A&P                           I suspect pt's sx are due to the cream which absorbed into her bloodstream due to the multiple open sores.    Pt's daughter is a good caretaker and is comfortable taking her mother home.    Pt is to return if worse.  F/u with pcp.  Final Clinical Impression(s) / ED Diagnoses Final diagnoses:  Altered mental status, unspecified altered mental status type  History of shingles    Rx / DC Orders ED Discharge Orders     None        Isla Pence, MD 05/07/21 2344

## 2021-05-07 NOTE — Discharge Instructions (Addendum)
Continue to hold the cream.  Encourage fluids.

## 2021-05-07 NOTE — ED Triage Notes (Signed)
Family member states she is confused today and is currently being treated for shingles

## 2021-05-08 ENCOUNTER — Encounter (HOSPITAL_COMMUNITY): Payer: Self-pay

## 2021-05-08 ENCOUNTER — Ambulatory Visit (HOSPITAL_COMMUNITY): Payer: Medicare Other

## 2021-05-14 ENCOUNTER — Ambulatory Visit (HOSPITAL_COMMUNITY): Payer: Medicare Other

## 2021-05-14 ENCOUNTER — Encounter: Payer: Self-pay | Admitting: *Deleted

## 2021-05-15 ENCOUNTER — Telehealth: Payer: Self-pay | Admitting: Cardiology

## 2021-05-15 NOTE — Telephone Encounter (Signed)
Spoke with the patient's daughter who states that the patient's dementia has worsened recently. The patient had been trying to chew her Imdur and Protonix and she spoke with the pharmacist who told her that these medications should not be chewed. The daughter has not given the patient these medications in about 10 days. She states that patient has felt fine. Has not had any chest pain. She would like to know if the patient needs to start back on these medications or if its okay for her to stay off of them.

## 2021-05-15 NOTE — Telephone Encounter (Signed)
Pt c/o medication issue:  1. Name of Medication:  isosorbide mononitrate (IMDUR) 60 MG 24 hr tablet pantoprazole (PROTONIX) 40 MG tablet  2. How are you currently taking this medication (dosage and times per day)? Per daughter, the patient has not taken either for ~10 days   3. Are you having a reaction (difficulty breathing--STAT)?   4. What is your medication issue? Patient had a spell of dementia and started to chew all of her medications. The daughter looked up these and saw that they should not be chewed, so she has not given them to the patient.  The daughter wanted to know if she should continue to hold it or start giving her the meds again. Please advise

## 2021-05-15 NOTE — Telephone Encounter (Signed)
Spoke with the patient's daughter and advised her that Dr. Radford Pax was okay with the patient not being on Protonix and Imdur. Patient's daughter verbalized understanding.

## 2021-07-20 ENCOUNTER — Ambulatory Visit
Admission: EM | Admit: 2021-07-20 | Discharge: 2021-07-20 | Disposition: A | Discharge status: Home / Self Care | Payer: Medicare Other

## 2021-07-20 ENCOUNTER — Encounter (HOSPITAL_BASED_OUTPATIENT_CLINIC_OR_DEPARTMENT_OTHER): Payer: Self-pay | Admitting: *Deleted

## 2021-07-20 ENCOUNTER — Emergency Department (HOSPITAL_BASED_OUTPATIENT_CLINIC_OR_DEPARTMENT_OTHER)
Admission: EM | Admit: 2021-07-20 | Discharge: 2021-07-21 | Disposition: A | Discharge status: Home / Self Care | Payer: Medicare Other | Attending: Emergency Medicine | Admitting: Emergency Medicine

## 2021-07-20 ENCOUNTER — Emergency Department (HOSPITAL_BASED_OUTPATIENT_CLINIC_OR_DEPARTMENT_OTHER): Payer: Medicare Other | Admitting: Radiology

## 2021-07-20 ENCOUNTER — Other Ambulatory Visit: Payer: Self-pay

## 2021-07-20 DIAGNOSIS — Z7951 Long term (current) use of inhaled steroids: Secondary | ICD-10-CM | POA: Insufficient documentation

## 2021-07-20 DIAGNOSIS — Z794 Long term (current) use of insulin: Secondary | ICD-10-CM | POA: Diagnosis not present

## 2021-07-20 DIAGNOSIS — Z8709 Personal history of other diseases of the respiratory system: Secondary | ICD-10-CM

## 2021-07-20 DIAGNOSIS — R0682 Tachypnea, not elsewhere classified: Secondary | ICD-10-CM

## 2021-07-20 DIAGNOSIS — Z7901 Long term (current) use of anticoagulants: Secondary | ICD-10-CM | POA: Insufficient documentation

## 2021-07-20 DIAGNOSIS — J189 Pneumonia, unspecified organism: Secondary | ICD-10-CM

## 2021-07-20 DIAGNOSIS — R058 Other specified cough: Secondary | ICD-10-CM

## 2021-07-20 DIAGNOSIS — R531 Weakness: Secondary | ICD-10-CM | POA: Insufficient documentation

## 2021-07-20 DIAGNOSIS — I10 Essential (primary) hypertension: Secondary | ICD-10-CM | POA: Diagnosis not present

## 2021-07-20 DIAGNOSIS — Z79899 Other long term (current) drug therapy: Secondary | ICD-10-CM | POA: Insufficient documentation

## 2021-07-20 DIAGNOSIS — I129 Hypertensive chronic kidney disease with stage 1 through stage 4 chronic kidney disease, or unspecified chronic kidney disease: Secondary | ICD-10-CM | POA: Diagnosis not present

## 2021-07-20 DIAGNOSIS — E1122 Type 2 diabetes mellitus with diabetic chronic kidney disease: Secondary | ICD-10-CM | POA: Insufficient documentation

## 2021-07-20 DIAGNOSIS — Z7984 Long term (current) use of oral hypoglycemic drugs: Secondary | ICD-10-CM | POA: Diagnosis not present

## 2021-07-20 DIAGNOSIS — N183 Chronic kidney disease, stage 3 unspecified: Secondary | ICD-10-CM | POA: Insufficient documentation

## 2021-07-20 DIAGNOSIS — R509 Fever, unspecified: Secondary | ICD-10-CM | POA: Diagnosis not present

## 2021-07-20 DIAGNOSIS — J9601 Acute respiratory failure with hypoxia: Secondary | ICD-10-CM | POA: Diagnosis not present

## 2021-07-20 DIAGNOSIS — I517 Cardiomegaly: Secondary | ICD-10-CM | POA: Diagnosis not present

## 2021-07-20 DIAGNOSIS — E1140 Type 2 diabetes mellitus with diabetic neuropathy, unspecified: Secondary | ICD-10-CM | POA: Diagnosis not present

## 2021-07-20 DIAGNOSIS — J45909 Unspecified asthma, uncomplicated: Secondary | ICD-10-CM | POA: Diagnosis not present

## 2021-07-20 DIAGNOSIS — R059 Cough, unspecified: Secondary | ICD-10-CM | POA: Diagnosis not present

## 2021-07-20 DIAGNOSIS — R0789 Other chest pain: Secondary | ICD-10-CM | POA: Diagnosis not present

## 2021-07-20 DIAGNOSIS — J90 Pleural effusion, not elsewhere classified: Secondary | ICD-10-CM | POA: Diagnosis not present

## 2021-07-20 LAB — CBC WITH DIFFERENTIAL/PLATELET
Abs Immature Granulocytes: 0.04 10*3/uL (ref 0.00–0.07)
Basophils Absolute: 0.1 10*3/uL (ref 0.0–0.1)
Basophils Relative: 0 %
Eosinophils Absolute: 0 10*3/uL (ref 0.0–0.5)
Eosinophils Relative: 0 %
HCT: 39.3 % (ref 36.0–46.0)
Hemoglobin: 13.6 g/dL (ref 12.0–15.0)
Immature Granulocytes: 0 %
Lymphocytes Relative: 16 %
Lymphs Abs: 2.1 10*3/uL (ref 0.7–4.0)
MCH: 33.2 pg (ref 26.0–34.0)
MCHC: 34.6 g/dL (ref 30.0–36.0)
MCV: 95.9 fL (ref 80.0–100.0)
Monocytes Absolute: 1.3 10*3/uL — ABNORMAL HIGH (ref 0.1–1.0)
Monocytes Relative: 10 %
Neutro Abs: 9.4 10*3/uL — ABNORMAL HIGH (ref 1.7–7.7)
Neutrophils Relative %: 74 %
Platelets: 257 10*3/uL (ref 150–400)
RBC: 4.1 MIL/uL (ref 3.87–5.11)
RDW: 11.9 % (ref 11.5–15.5)
WBC: 12.9 10*3/uL — ABNORMAL HIGH (ref 4.0–10.5)
nRBC: 0 % (ref 0.0–0.2)

## 2021-07-20 LAB — COMPREHENSIVE METABOLIC PANEL
ALT: 20 U/L (ref 0–44)
AST: 20 U/L (ref 15–41)
Albumin: 4 g/dL (ref 3.5–5.0)
Alkaline Phosphatase: 132 U/L — ABNORMAL HIGH (ref 38–126)
Anion gap: 13 (ref 5–15)
BUN: 12 mg/dL (ref 8–23)
CO2: 24 mmol/L (ref 22–32)
Calcium: 9.8 mg/dL (ref 8.9–10.3)
Chloride: 102 mmol/L (ref 98–111)
Creatinine, Ser: 0.68 mg/dL (ref 0.44–1.00)
GFR, Estimated: >60 mL/min (ref >60)
Glucose, Bld: 134 mg/dL — ABNORMAL HIGH (ref 70–99)
Potassium: 3.3 mmol/L — ABNORMAL LOW (ref 3.5–5.1)
Sodium: 139 mmol/L (ref 135–145)
Total Bilirubin: 1.5 mg/dL — ABNORMAL HIGH (ref 0.3–1.2)
Total Protein: 7.2 g/dL (ref 6.5–8.1)

## 2021-07-20 LAB — LACTIC ACID, PLASMA: Lactic Acid, Venous: 1.4 mmol/L (ref 0.5–1.9)

## 2021-07-20 MED ORDER — CEFTRIAXONE SODIUM 1 G IJ SOLR
1.0000 g | Freq: Once | INTRAMUSCULAR | Status: AC
  Administered 2021-07-21: 1 g via INTRAVENOUS
  Filled 2021-07-20: qty 10

## 2021-07-20 MED ORDER — DOXYCYCLINE HYCLATE 100 MG PO CAPS: 100.0000 mg | ORAL_CAPSULE | Freq: Two times a day (BID) | ORAL | 0 refills | Status: DC

## 2021-07-20 MED ORDER — DOXYCYCLINE HYCLATE 100 MG PO TABS
100.0000 mg | ORAL_TABLET | Freq: Once | ORAL | Status: AC
  Administered 2021-07-21: 100 mg via ORAL
  Filled 2021-07-20: qty 1

## 2021-07-20 MED ORDER — IPRATROPIUM-ALBUTEROL 0.5-2.5 (3) MG/3ML IN SOLN
RESPIRATORY_TRACT | Status: AC
  Administered 2021-07-20: 3 mL via RESPIRATORY_TRACT
  Filled 2021-07-20: qty 3

## 2021-07-20 MED ORDER — ALBUTEROL SULFATE HFA 108 (90 BASE) MCG/ACT IN AERS
2.0000 | INHALATION_SPRAY | RESPIRATORY_TRACT | Status: DC | PRN
  Filled 2021-07-20: qty 6.7

## 2021-07-20 MED ORDER — ALBUTEROL SULFATE HFA 108 (90 BASE) MCG/ACT IN AERS
2.0000 | INHALATION_SPRAY | Freq: Four times a day (QID) | RESPIRATORY_TRACT | Status: DC
Start: 2021-07-21 — End: 2021-07-21

## 2021-07-20 MED ORDER — IPRATROPIUM-ALBUTEROL 0.5-2.5 (3) MG/3ML IN SOLN: 3.0000 mL | Freq: Once | RESPIRATORY_TRACT | Status: AC

## 2021-07-20 MED ORDER — SODIUM CHLORIDE 0.9 % IV SOLN: INTRAVENOUS | Status: DC

## 2021-07-20 NOTE — ED Triage Notes (Signed)
Two weeks ago developed runny nose and cough. Clear secretions now secretions are yellow with some green and blood tinged at intervals.Chest discomfort when coughing

## 2021-07-20 NOTE — ED Notes (Signed)
RT educated/instructed pt and family member on proper use of MDI w/spacer. Pt able to perform w/some difficulty, pt lives w/daughter who helps w/medication administration. Pts daughter voices understanding of proper use.

## 2021-07-20 NOTE — ED Provider Notes (Signed)
[facility]    CSN: [id number] Arrival date & time: 07/20/21  1409      History   Chief Complaint Chief Complaint  Patient presents with   Cough    HPI Christine Juarez is a 87 y.o. female.   Patient here today with daughter for evaluation of 2-week history of progressively worsening productive cough, difficulty breathing, decreased appetite and p.o. intake, weakness.  History significant for history of respiratory failure with hypoxia, chronic bronchitis, CAD.  Not currently taking anything over-the-counter for symptoms.   Past Medical History:  Diagnosis Date   Aortic stenosis    mild by echo 08/2015   Arthritis    CAD (coronary artery disease), native coronary artery    s/p remote CABG   HTN (hypertension)    Hyperlipidemia    Neuropathy    Thyroid disease    TIA (transient ischemic attack)    hx of    Patient Active Problem List   Diagnosis Date Noted   Acute respiratory failure with hypoxia ([place]) 03/24/2018   CAD (coronary artery disease), native coronary artery 03/24/2018   Closed fracture of left pubis ([place]) 03/23/2018   Aortic stenosis 08/10/2016   Diarrhea 05/09/2012   Rotator cuff syndrome of right shoulder 08/03/2011   Rotator cuff tear, right 08/03/2011   TIA 03/12/2008   HIP PAIN, BILATERAL 03/12/2008   ASTHMATIC BRONCHITIS, ACUTE 10/25/2007   PERIPHERAL NEUROPATHY 05/03/2007   DYSEQUILIBRIUM 05/03/2007   LEG PAIN 04/14/2007   ANKLE PAIN, LEFT 03/27/2007   EDEMA LEG 03/27/2007   BENIGN POSITIONAL VERTIGO 12/02/2006   HEADACHE 12/02/2006   COLONIC POLYPS 10/21/2006   Hypothyroidism 10/21/2006   Dyslipidemia 10/21/2006   COMMON MIGRAINE 10/21/2006   Essential hypertension 10/21/2006   ASTHMA 10/21/2006   IRRITABLE BOWEL SYNDROME 10/21/2006   FIBROCYSTIC BREAST DISEASE 10/21/2006   DEGENERATIVE DISC DISEASE 10/21/2006   LOW BACK PAIN 10/21/2006    Past Surgical History:  Procedure Laterality Date   APPENDECTOMY     ARTERIAL  BYPASS SURGRY     BACK SURGERY     COLONOSCOPY     LIPOMA EXCISION     SKULL FRACTURE ELEVATION     UPPER GASTROINTESTINAL ENDOSCOPY      OB History   No obstetric history on file.      Home Medications    Prior to Admission medications   Medication Sig Start Date End Date Taking? Authorizing Provider  acetaminophen (TYLENOL) 500 MG tablet Take 1,000 mg by mouth every 6 (six) hours as needed.    [provider]  aspirin 81 MG chewable tablet Chew 81 mg by mouth daily. Before bed    [provider]  aspirin 81 MG tablet Take 81 mg by mouth daily.      [provider]  Coenzyme Q10 (COQ10) 100 MG CAPS Take 1 capsule by mouth daily.     [provider]  Cyanocobalamin (VITAMIN B-12 SL) Place under the tongue daily.    [provider]  ezetimibe (ZETIA) 10 MG tablet TAKE 1 TABLET BY MOUTH ONCE DAILY FOR CHOLESTEROL. 09/17/20   Turner, Eber Hong, MD  NORVASC 5 MG tablet TAKE (1) TABLET BY MOUTH ONCE DAILY. 01/22/21   [name], MD  SYNTHROID 100 MCG tablet Take 100 mcg by mouth daily. 06/27/20   [provider]  SYNTHROID 88 MCG tablet Take 88 mcg by mouth daily. 04/30/21   [provider]    Family History Family History  Problem Relation  Age of Onset   Ovarian cancer Sister    Colon cancer Sister    Dementia Sister    GER disease Daughter    Anxiety disorder Daughter    Hyperlipidemia Daughter    Hypertension Son    Arthritis Other    Cancer Other    Colon cancer Brother     Social History Social History   Tobacco Use   Smoking status: Never   Smokeless tobacco: Never  Vaping Use   Vaping Use: Never used  Substance Use Topics   Alcohol use: No   Drug use: Never     Allergies   Codeine, Dicyclomine hcl, Erythromycin, Lodine [etodolac], Meclizine hcl, Niacin, and Statins   Review of Systems Review of Systems Per HPI  Physical Exam Triage Vital Signs ED Triage Vitals  Enc Vitals Group     BP  07/20/21 1456 132/68     Pulse Rate 07/20/21 1456 89     Resp 07/20/21 1456 (!) 22     Temp 07/20/21 1456 99.8 F (37.7 C)     Temp Source 07/20/21 1456 Temporal     SpO2 07/20/21 1456 (!) 89 %     Weight --      Height --      Head Circumference --      Peak Flow --      Pain Score 07/20/21 1450 0     Pain Loc --      Pain Edu? --      Excl. in [place]? --    No data found.  Updated Vital Signs BP 132/68   Pulse 89   Temp 99.8 F (37.7 C) (Temporal)   Resp (!) 22   SpO2 96%   Visual Acuity Right Eye Distance:   Left Eye Distance:   Bilateral Distance:    Right Eye Near:   Left Eye Near:    Bilateral Near:      Exam abbreviated today as decision was already made to call EMS for transport to the hospital. Physical Exam Vitals and nursing note reviewed.  Constitutional:      Appearance: Normal appearance. She is not ill-appearing.  HENT:     Head: Atraumatic.     Mouth/Throat:     Mouth: Mucous membranes are moist.     Pharynx: Oropharynx is clear.  Eyes:     Extraocular Movements: Extraocular movements intact.     Conjunctiva/sclera: Conjunctivae normal.  Cardiovascular:     Rate and Rhythm: Normal rate and regular rhythm.     Heart sounds: Normal heart sounds.  Pulmonary:     Comments: Mildly labored breathing, audible wheezes and rales from across the room Musculoskeletal:        General: Normal range of motion.     Cervical back: Normal range of motion and neck supple.  Skin:    General: Skin is warm and dry.  Neurological:     Mental Status: She is alert and oriented to person, place, and time.  Psychiatric:        Mood and Affect: Mood normal.        Thought Content: Thought content normal.        Judgment: Judgment normal.   UC Treatments / Results  Labs (all labs ordered are listed, but only abnormal results are displayed) Labs Reviewed - No data to display  EKG   Radiology No results found.  Procedures Procedures (including critical care  time)  Medications Ordered in UC Medications - No  data to display  Initial Impression / Assessment and Plan / UC Course  I have reviewed the triage vital signs and the nursing notes.  Pertinent labs & imaging results that were available during my care of the patient were reviewed by me and considered in my medical decision making (see chart for details).     Patient tachypneic, hypoxic to 88% on room air, temp of 99.8 F in triage with significant productive cough audible and wheezes and rales audible grossly.  She appears weak, ill.  Patient was placed on 4 L of O2 via nasal cannula and oxygen improved to 96%.  Discussed at length with patient and daughter that we unfortunately do not have the resources to evaluate the extent of the symptoms that patient is experiencing, particularly as we do not have access to onsite x-ray today and are unable to obtain same-day labs.  Do have significant concern that she has pneumonia, possibly progressing towards sepsis.  Feel she needs a much higher level of care than what we have available to [place] in the setting, EMS called given her hypoxia but unfortunately upon arrival of EMS, paramedics encouraged patient's daughter to drive her via private vehicle to [facility] for further evaluation which daughter readily agreed to.  This is against my recommendation to go via EMS to the hospital so oxygen can be continuous and patient's vital signs can be monitored.  Ultimately patient left via private vehicle with daughter to go to [facility].   Final Clinical Impressions(s) / UC Diagnoses   Final diagnoses:  Acute respiratory failure with hypoxia (HCC)  Weakness  Productive cough  Fever, unspecified  Tachypnea  History of chronic bronchitis   Discharge Instructions   None    ED Prescriptions   None    PDMP not reviewed this encounter.   Volney American, [place] 07/20/21 1533

## 2021-07-20 NOTE — ED Provider Notes (Signed)
[facility] EMERGENCY DEPT Provider Note   CSN: [id number] Arrival date & time: 07/20/21  1656     History Chief Complaint  Patient presents with   Cough    Christine Juarez is a 87 y.o. female.  Patient sent in from urgent care.  Actually urgent care up in [facility] recommended patient be taken by EMS they refused.  Patient has had upper respiratory symptoms and cough cough and clear secretions but sometimes yellow.  Patient feeling short of breath.  Temp here 100.9.  This is been going on for about 2 weeks but is gotten worse.  Patient refuses COVID testing.  Patient has not had any of the COVID vaccines.  Past medical history significant for aortic stenosis coronary artery disease hypertension hyperlipidemia.  Patient is here with her daughter.      Past Medical History:  Diagnosis Date   Aortic stenosis    mild by echo 08/2015   Arthritis    CAD (coronary artery disease), native coronary artery    s/p remote CABG   HTN (hypertension)    Hyperlipidemia    Neuropathy    Thyroid disease    TIA (transient ischemic attack)    hx of    Patient Active Problem List   Diagnosis Date Noted   Acute respiratory failure with hypoxia ([place]) 03/24/2018   CAD (coronary artery disease), native coronary artery 03/24/2018   Closed fracture of left pubis ([place]) 03/23/2018   Aortic stenosis 08/10/2016   Diarrhea 05/09/2012   Rotator cuff syndrome of right shoulder 08/03/2011   Rotator cuff tear, right 08/03/2011   TIA 03/12/2008   HIP PAIN, BILATERAL 03/12/2008   ASTHMATIC BRONCHITIS, ACUTE 10/25/2007   PERIPHERAL NEUROPATHY 05/03/2007   DYSEQUILIBRIUM 05/03/2007   LEG PAIN 04/14/2007   ANKLE PAIN, LEFT 03/27/2007   EDEMA LEG 03/27/2007   BENIGN POSITIONAL VERTIGO 12/02/2006   HEADACHE 12/02/2006   COLONIC POLYPS 10/21/2006   Hypothyroidism 10/21/2006   Dyslipidemia 10/21/2006   COMMON MIGRAINE 10/21/2006   Essential hypertension 10/21/2006   ASTHMA  10/21/2006   IRRITABLE BOWEL SYNDROME 10/21/2006   FIBROCYSTIC BREAST DISEASE 10/21/2006   DEGENERATIVE DISC DISEASE 10/21/2006   LOW BACK PAIN 10/21/2006    Past Surgical History:  Procedure Laterality Date   APPENDECTOMY     ARTERIAL BYPASS SURGRY     BACK SURGERY     COLONOSCOPY     LIPOMA EXCISION     SKULL FRACTURE ELEVATION     UPPER GASTROINTESTINAL ENDOSCOPY       OB History   No obstetric history on file.     Family History  Problem Relation Age of Onset   Ovarian cancer Sister    Colon cancer Sister    Dementia Sister    GER disease Daughter    Anxiety disorder Daughter    Hyperlipidemia Daughter    Hypertension Son    Arthritis Other    Cancer Other    Colon cancer Brother     Social History   Tobacco Use   Smoking status: Never   Smokeless tobacco: Never  Vaping Use   Vaping Use: Never used  Substance Use Topics   Alcohol use: No   Drug use: Never    Home Medications Prior to Admission medications   Medication Sig Start Date End Date Taking? Authorizing Provider  acetaminophen (TYLENOL) 500 MG tablet Take 1,000 mg by mouth every 6 (six) hours as needed.    [provider]  aspirin 81 MG  chewable tablet Chew 81 mg by mouth daily. Before bed    [provider]  aspirin 81 MG tablet Take 81 mg by mouth daily.      [provider]  Coenzyme Q10 (COQ10) 100 MG CAPS Take 1 capsule by mouth daily.     [provider]  Cyanocobalamin (VITAMIN B-12 SL) Place under the tongue daily.    [provider]  ezetimibe (ZETIA) 10 MG tablet TAKE 1 TABLET BY MOUTH ONCE DAILY FOR CHOLESTEROL. 09/17/20   Turner, Eber Hong, MD  NORVASC 5 MG tablet TAKE (1) TABLET BY MOUTH ONCE DAILY. 01/22/21   [name], MD  SYNTHROID 100 MCG tablet Take 100 mcg by mouth daily. 06/27/20   [provider]  SYNTHROID 88 MCG tablet Take 88 mcg by mouth daily. 04/30/21   [provider]    Allergies    Codeine,  Dicyclomine hcl, Erythromycin, Lodine [etodolac], Meclizine hcl, Niacin, and Statins  Review of Systems   Review of Systems  Constitutional:  Negative for chills and fever.  HENT:  Positive for congestion. Negative for ear pain and sore throat.   Eyes:  Negative for pain and visual disturbance.  Respiratory:  Positive for cough and shortness of breath.   Cardiovascular:  Negative for chest pain and palpitations.  Gastrointestinal:  Negative for abdominal pain and vomiting.  Genitourinary:  Negative for dysuria and hematuria.  Musculoskeletal:  Negative for arthralgias and back pain.  Skin:  Negative for color change and rash.  Neurological:  Negative for seizures and syncope.  All other systems reviewed and are negative.  Physical Exam Updated Vital Signs BP (!) 150/77   Pulse 85   Temp (!) 100.9 F (38.3 C) (Oral)   Resp (!) 24   SpO2 94%   Physical Exam Vitals and nursing note reviewed.  Constitutional:      General: She is not in acute distress.    Appearance: Normal appearance. She is well-developed.  HENT:     Head: Normocephalic and atraumatic.  Eyes:     Extraocular Movements: Extraocular movements intact.     Conjunctiva/sclera: Conjunctivae normal.     Pupils: Pupils are equal, round, and reactive to light.  Cardiovascular:     Rate and Rhythm: Normal rate and regular rhythm.     Heart sounds: No murmur heard. Pulmonary:     Effort: Pulmonary effort is normal. No respiratory distress.     Breath sounds: Wheezing and rhonchi present. No rales.  Abdominal:     Palpations: Abdomen is soft.     Tenderness: There is no abdominal tenderness.  Musculoskeletal:        General: No swelling.     Cervical back: Normal range of motion and neck supple. No rigidity.  Skin:    General: Skin is warm and dry.  Neurological:     General: No focal deficit present.     Mental Status: She is alert and oriented to person, place, and time.     Cranial Nerves: No cranial nerve  deficit.     Sensory: No sensory deficit.     Motor: No weakness.    ED Results / Procedures / Treatments   Labs (all labs ordered are listed, but only abnormal results are displayed) Labs Reviewed - No data to display  EKG EKG Interpretation  Date/Time:  Monday July 20 2021 17:27:43 EDT Ventricular Rate:  82 PR Interval:  212 QRS Duration: 86 QT Interval:  304 QTC Calculation: 355  R Axis:   45 Text Interpretation: Sinus rhythm with 1st degree A-V block Minimal voltage criteria for LVH, may be normal variant ( Cornell product ) ST & T wave abnormality, consider inferior ischemia ST & T wave abnormality, consider anterolateral ischemia Abnormal ECG No significant change since last tracing Confirmed by Fredia Sorrow [phone]) on 07/20/2021 5:39:25 PM  Radiology DG Chest 2 View  Result Date: 07/20/2021 CLINICAL DATA:  Cough EXAM: CHEST - 2 VIEW COMPARISON:  05/07/2021 FINDINGS: Post sternotomy changes. Small right-sided pleural effusion. Mild diffuse chronic interstitial opacity. Hazy atelectasis or minimal infiltrate at the right base. Mild cardiomegaly. No pneumothorax IMPRESSION: 1. Probable small right effusion with hazy atelectasis or minimal pneumonia right base 2. Mild cardiomegaly Electronically Signed   By: Donavan Foil M.D.   On: 07/20/2021 18:49    Procedures Procedures   Medications Ordered in ED Medications  albuterol (VENTOLIN HFA) 108 (90 Base) MCG/ACT inhaler 2 puff (has no administration in time range)    ED Course  I have reviewed the triage vital signs and the nursing notes.  Pertinent labs & imaging results that were available during my care of the patient were reviewed by me and considered in my medical decision making (see chart for details).    MDM Rules/Calculators/A&P                           Patient fairly well-appearing considering she is 89.  Patient with some wheezing.  The patient received albuterol and that cleared.  Chest x-ray raises  concerns for right lower lobe pneumonia.  Patient refuses COVID testing even to rule in or rule out COVID or influenza.  Patient's white blood cell count 12.9.  Hemoglobin normal.  Potassium a little low at 3.3.  Otherwise complete metabolic panel without significant abnormalities total bili 1.5.  Patient's abdomen is soft and nontender.  Alk phos elevated a little bit at 132.  GFR is normal.  Patient's lactic acid was normal at 1.4.  Which is reassuring blood cultures were done and are pending.  Chest x-ray with most likely right-sided pneumonia base infection.  This would go along with the white count and the fever.  Patient does not meet sepsis criteria otherwise.  Lactic acid was normal.  White count is below 14.  Did have fever respiratory rate was up at 1 point.  But not tachycardic blood pressures been fine.  Oxygen saturation apparently urgent care was low.  Here its been in the low 90s.  We ambulated patient it did go down to 88 but she actually looked quite comfortable ambulating with her walker.  Was able to walk pretty long distances.  Because of the desaturation though did recommend admission but patient and daughter refused.  They want IV antibiotics here we will give IV Rocephin.  Patient has a rifamycin allergies of Zithromax not an option we will treat with oral doxycycline and continue oral doxycycline at home.  Patient follow-up with primary care doctor and return for any new or worse symptoms.  Again patient refused COVID testing and patient refused admission. Final Clinical Impression(s) / ED Diagnoses Final diagnoses:  None    Rx / DC Orders ED Discharge Orders     None        Fredia Sorrow, MD 07/20/21 2351

## 2021-07-20 NOTE — ED Triage Notes (Signed)
EMS at bed side. EMS staff instructed Pt's Family that [facility] was full and Daughter could take Pt by Private Vehicle  to [facility] to be evaluated .  Pt left AMA with Daughter to go to [facility] bu [employer].

## 2021-07-20 NOTE — Discharge Instructions (Signed)
Continue to take the antibiotic doxycycline for the next 7 days.  You take it twice a day.  Return for any new or worse symptoms.  Make an appointment to follow-up with your doctor.  Continue to use the albuterol inhaler 2 puffs every 6 hours.

## 2021-07-20 NOTE — ED Triage Notes (Signed)
Family reports Pt has had a cough for 2 weeks .

## 2021-07-21 DIAGNOSIS — R0789 Other chest pain: Secondary | ICD-10-CM | POA: Diagnosis not present

## 2021-07-21 NOTE — ED Notes (Signed)
Patient verbalizes understanding of discharge instructions. Opportunity for questioning and answers were provided. Armband removed by staff, pt discharged from ED. Wheeled out to lobby with daughter

## 2021-07-26 LAB — CULTURE, BLOOD (ROUTINE X 2)
Culture: NO GROWTH
Special Requests: ADEQUATE

## 2021-07-27 ENCOUNTER — Other Ambulatory Visit: Payer: Self-pay | Admitting: Cardiology

## 2021-08-05 DIAGNOSIS — Z8673 Personal history of transient ischemic attack (TIA), and cerebral infarction without residual deficits: Secondary | ICD-10-CM | POA: Diagnosis not present

## 2021-08-05 DIAGNOSIS — I10 Essential (primary) hypertension: Secondary | ICD-10-CM | POA: Diagnosis not present

## 2021-08-05 DIAGNOSIS — E039 Hypothyroidism, unspecified: Secondary | ICD-10-CM | POA: Diagnosis not present

## 2021-08-05 DIAGNOSIS — J189 Pneumonia, unspecified organism: Secondary | ICD-10-CM | POA: Diagnosis not present

## 2021-09-22 ENCOUNTER — Other Ambulatory Visit: Payer: Self-pay | Admitting: Cardiology

## 2021-09-25 ENCOUNTER — Telehealth: Payer: Medicare Other | Admitting: Cardiology

## 2021-11-02 ENCOUNTER — Telehealth: Payer: Medicare Other | Admitting: Cardiology

## 2021-11-23 ENCOUNTER — Other Ambulatory Visit: Payer: Self-pay | Admitting: Cardiology

## 2021-12-03 ENCOUNTER — Other Ambulatory Visit: Payer: Self-pay

## 2021-12-03 ENCOUNTER — Telehealth (INDEPENDENT_AMBULATORY_CARE_PROVIDER_SITE_OTHER): Payer: Medicare Other | Admitting: Cardiology

## 2021-12-03 ENCOUNTER — Encounter: Payer: Self-pay | Admitting: Cardiology

## 2021-12-03 VITALS — BP 125/72 | HR 69 | Ht 60.0 in | Wt 127.5 lb

## 2021-12-03 DIAGNOSIS — E785 Hyperlipidemia, unspecified: Secondary | ICD-10-CM

## 2021-12-03 DIAGNOSIS — I2510 Atherosclerotic heart disease of native coronary artery without angina pectoris: Secondary | ICD-10-CM

## 2021-12-03 DIAGNOSIS — I10 Essential (primary) hypertension: Secondary | ICD-10-CM

## 2021-12-03 DIAGNOSIS — I350 Nonrheumatic aortic (valve) stenosis: Secondary | ICD-10-CM

## 2021-12-03 MED ORDER — AMLODIPINE BESYLATE 5 MG PO TABS: 5.0000 mg | ORAL_TABLET | Freq: Every day | ORAL | 3 refills | Status: DC

## 2021-12-03 NOTE — Patient Instructions (Signed)

## 2021-12-03 NOTE — Progress Notes (Addendum)
Virtual Visit via Video Note   This visit type was conducted due to national recommendations for restrictions regarding the COVID-19 Pandemic (e.g. social distancing) in an effort to limit this patient's exposure and mitigate transmission in our community.  Due to her co-morbid illnesses, this patient is at least at moderate risk for complications without adequate follow up.  This format is felt to be most appropriate for this patient at this time.  All issues noted in this document were discussed and addressed.  A limited physical exam was performed with this format.  Please refer to the patient's chart for her consent to telehealth for [facility].     Date:  12/14/2021   ID:  Christine Juarez, DOB 11/10/1919, MRN [id number] The patient was identified using 2 identifiers.  Patient Location: Home Provider Location: Office/Clinic   PCP:  Celene Squibb, MD  Cardiologist:  Fransico Him, MD  Electrophysiologist:  None   Chief Complaint:  CAD, HTN, HLD  History of Present Illness:     Christine Juarez is a 88 y.o. female with a hx of ASCAD s/p remote CABG, HTN, and dyslipidemia.  She is here today for followup and is doing well.  She denies any chest pain or pressure, SOB, DOE, PND, orthopnea,  dizziness, palpitations or syncope. She occasionally has some mild pedal edema.  She is compliant with her meds and is tolerating meds with no SE.     Prior CV studies:   The following studies were reviewed today:  none  Past Medical History:  Diagnosis Date   Aortic stenosis    mild by echo 08/2015   Arthritis    CAD (coronary artery disease), native coronary artery    s/p remote CABG   HTN (hypertension)    Hyperlipidemia    Neuropathy    Thyroid disease    TIA (transient ischemic attack)    hx of   Past Surgical History:  Procedure Laterality Date   APPENDECTOMY     ARTERIAL BYPASS SURGRY     BACK SURGERY     COLONOSCOPY     LIPOMA EXCISION     SKULL FRACTURE ELEVATION      UPPER GASTROINTESTINAL ENDOSCOPY       Current Meds  Medication Sig   acetaminophen (TYLENOL) 500 MG tablet Take 1,000 mg by mouth every 6 (six) hours as needed.   amLODipine (NORVASC) 5 MG tablet Take 5 mg by mouth daily.   aspirin 81 MG chewable tablet Chew 81 mg by mouth daily. Before bed   aspirin 81 MG tablet Take 81 mg by mouth daily.     Coenzyme Q10 (COQ10) 100 MG CAPS Take 1 capsule by mouth daily.    Cyanocobalamin (VITAMIN B-12 SL) Place under the tongue daily.   esomeprazole (NEXIUM) 20 MG capsule Take 20 mg by mouth daily at 12 noon.   ezetimibe (ZETIA) 10 MG tablet TAKE 1 TABLET BY MOUTH ONCE DAILY FOR CHOLESTEROL. Please keep upcoming appt in January 2023 with Dr. Radford Pax before anymore refills. Thank you Final Attempt   SYNTHROID 88 MCG tablet Take 88 mcg by mouth daily.     Allergies:   Codeine, Dicyclomine hcl, Erythromycin, Lodine [etodolac], Meclizine hcl, Niacin, and Statins   Social History   Tobacco Use   Smoking status: Never   Smokeless tobacco: Never  Vaping Use   Vaping Use: Never used  Substance Use Topics   Alcohol use: No   Drug use: Never  Family Hx: The patient's family history includes Anxiety disorder in her daughter; Arthritis in an other family member; Cancer in an other family member; Colon cancer in her brother and sister; Dementia in her sister; GER disease in her daughter; Hyperlipidemia in her daughter; Hypertension in her son; Ovarian cancer in her sister.  ROS:   Please see the history of present illness.     All other systems reviewed and are negative.   Labs/Other Tests and Data Reviewed:    Recent Labs: 07/20/2021: ALT 20; BUN 12; Creatinine, Ser 0.68; Hemoglobin 13.6; Platelets 257; Potassium 3.3; Sodium 139   Recent Lipid Panel Lab Results  Component Value Date/Time   CHOL 160 01/27/2015 10:33 AM   TRIG 105.0 01/27/2015 10:33 AM   HDL 56.90 01/27/2015 10:33 AM   CHOLHDL 3 01/27/2015 10:33 AM   LDLCALC 82 01/27/2015  10:33 AM    Wt Readings from Last 3 Encounters:  12/03/21 127 lb 8 oz (57.8 kg)  09/09/20 127 lb 9.6 oz (57.9 kg)  08/09/20 135 lb (61.2 kg)     Objective:    Vital Signs:  BP 125/72 (BP Location: Left Arm, Patient Position: Sitting, Cuff Size: Normal)    Pulse 69    Ht 5' (1.524 m)    Wt 127 lb 8 oz (57.8 kg)    BMI 24.90 kg/m    Well nourished, well developed female in no acute distress. Well appearing, alert and conversant, regular work of breathing,  good skin color  Eyes- anicteric mouth- oral mucosa is pink  neuro- grossly intact skin- no apparent rash or lesions or cyanosis  ASSESSMENT & PLAN:    1.  ASCAD -s/p remote CABG -she has not had any anginal symptoms since I saw her last -Continue prescription drug management with aspirin 81 mg daily and amlodipine 5 mg daily with as needed refills -She is statin intolerant  2.  HTN -BP is well controlled on exam -Continue prescription drug management amlodipine 5 mg daily with as needed refills  3.  Aortic stenosis -mild by echo 2017 with mean AVG 6mmHg -given advanced age will not pursue repeat echo as not a candidate for invasive procedures due to advanced age and asymptomatic  4.  HLD -LDL goal < 70 -continue Zetia 10mg  daily with PRN refills -she is statin intolerant and due to advanced age [id number] not pursued.   COVID-19 Education: The signs and symptoms of COVID-19 were discussed with the patient and how to seek care for testing (follow up with PCP or arrange E-visit).  The importance of social distancing was discussed today.  Time:   Today, I have spent 20 minutes with the patient with telehealth technology discussing the above problems.     Medication Adjustments/Labs and Tests Ordered: Current medicines are reviewed at length with the patient today.  Concerns regarding medicines are outlined above.  Tests Ordered: No orders of the defined types were placed in this encounter.  Medication Changes: No  orders of the defined types were placed in this encounter.   Disposition:  Follow up in 1 year(s)  Signed, Fransico Him, MD  12/03/2021 1:42 PM    [facility]

## 2021-12-03 NOTE — Addendum Note (Signed)
Addended by: Antonieta Iba on: 12/03/2021 02:10 PM   Modules accepted: Orders

## 2021-12-07 ENCOUNTER — Other Ambulatory Visit: Payer: Self-pay | Admitting: Cardiology

## 2021-12-08 MED ORDER — EZETIMIBE 10 MG PO TABS: ORAL_TABLET | ORAL | 3 refills | Status: DC

## 2022-01-06 ENCOUNTER — Encounter: Payer: Self-pay | Admitting: Cardiology

## 2022-01-18 MED ORDER — AMLODIPINE BESYLATE 5 MG PO TABS: 5.0000 mg | ORAL_TABLET | Freq: Every day | ORAL | 3 refills | Status: DC

## 2022-02-23 DIAGNOSIS — I350 Nonrheumatic aortic (valve) stenosis: Secondary | ICD-10-CM | POA: Diagnosis not present

## 2022-02-23 DIAGNOSIS — Z8673 Personal history of transient ischemic attack (TIA), and cerebral infarction without residual deficits: Secondary | ICD-10-CM | POA: Diagnosis not present

## 2022-02-23 DIAGNOSIS — R32 Unspecified urinary incontinence: Secondary | ICD-10-CM | POA: Diagnosis not present

## 2022-02-23 DIAGNOSIS — I25118 Atherosclerotic heart disease of native coronary artery with other forms of angina pectoris: Secondary | ICD-10-CM | POA: Diagnosis not present

## 2022-02-23 DIAGNOSIS — E039 Hypothyroidism, unspecified: Secondary | ICD-10-CM | POA: Diagnosis not present

## 2022-02-23 DIAGNOSIS — R7301 Impaired fasting glucose: Secondary | ICD-10-CM | POA: Diagnosis not present

## 2022-02-23 DIAGNOSIS — G609 Hereditary and idiopathic neuropathy, unspecified: Secondary | ICD-10-CM | POA: Diagnosis not present

## 2022-02-23 DIAGNOSIS — E782 Mixed hyperlipidemia: Secondary | ICD-10-CM | POA: Diagnosis not present

## 2022-02-23 DIAGNOSIS — I10 Essential (primary) hypertension: Secondary | ICD-10-CM | POA: Diagnosis not present

## 2022-07-15 DIAGNOSIS — H6121 Impacted cerumen, right ear: Secondary | ICD-10-CM | POA: Diagnosis not present

## 2022-07-15 DIAGNOSIS — R42 Dizziness and giddiness: Secondary | ICD-10-CM | POA: Diagnosis not present

## 2022-07-19 DIAGNOSIS — H9313 Tinnitus, bilateral: Secondary | ICD-10-CM | POA: Diagnosis not present

## 2022-07-19 DIAGNOSIS — H6121 Impacted cerumen, right ear: Secondary | ICD-10-CM | POA: Diagnosis not present

## 2022-07-19 DIAGNOSIS — R42 Dizziness and giddiness: Secondary | ICD-10-CM | POA: Diagnosis not present

## 2022-08-26 DIAGNOSIS — G609 Hereditary and idiopathic neuropathy, unspecified: Secondary | ICD-10-CM | POA: Diagnosis not present

## 2022-08-26 DIAGNOSIS — H1013 Acute atopic conjunctivitis, bilateral: Secondary | ICD-10-CM | POA: Diagnosis not present

## 2022-08-26 DIAGNOSIS — E039 Hypothyroidism, unspecified: Secondary | ICD-10-CM | POA: Diagnosis not present

## 2022-08-26 DIAGNOSIS — R32 Unspecified urinary incontinence: Secondary | ICD-10-CM | POA: Diagnosis not present

## 2022-08-26 DIAGNOSIS — I350 Nonrheumatic aortic (valve) stenosis: Secondary | ICD-10-CM | POA: Diagnosis not present

## 2022-08-26 DIAGNOSIS — Z8673 Personal history of transient ischemic attack (TIA), and cerebral infarction without residual deficits: Secondary | ICD-10-CM | POA: Diagnosis not present

## 2022-08-26 DIAGNOSIS — Z0000 Encounter for general adult medical examination without abnormal findings: Secondary | ICD-10-CM | POA: Diagnosis not present

## 2022-08-26 DIAGNOSIS — I10 Essential (primary) hypertension: Secondary | ICD-10-CM | POA: Diagnosis not present

## 2022-08-26 DIAGNOSIS — E782 Mixed hyperlipidemia: Secondary | ICD-10-CM | POA: Diagnosis not present

## 2022-08-26 DIAGNOSIS — I25118 Atherosclerotic heart disease of native coronary artery with other forms of angina pectoris: Secondary | ICD-10-CM | POA: Diagnosis not present

## 2022-08-26 DIAGNOSIS — E559 Vitamin D deficiency, unspecified: Secondary | ICD-10-CM | POA: Diagnosis not present

## 2022-08-26 DIAGNOSIS — R7301 Impaired fasting glucose: Secondary | ICD-10-CM | POA: Diagnosis not present

## 2022-08-27 DIAGNOSIS — R7301 Impaired fasting glucose: Secondary | ICD-10-CM | POA: Diagnosis not present

## 2022-11-02 ENCOUNTER — Telehealth: Payer: Self-pay | Admitting: Cardiology

## 2022-11-02 NOTE — Telephone Encounter (Signed)
Daughter would like to schedule an afternoon video visit with Dr. Radford Pax, any day except Wednesdays for the patient.

## 2022-12-20 ENCOUNTER — Other Ambulatory Visit: Payer: Self-pay | Admitting: Cardiology

## 2022-12-23 DIAGNOSIS — H04123 Dry eye syndrome of bilateral lacrimal glands: Secondary | ICD-10-CM | POA: Diagnosis not present

## 2023-01-10 ENCOUNTER — Other Ambulatory Visit: Payer: Self-pay | Admitting: Cardiology

## 2023-02-24 DIAGNOSIS — I350 Nonrheumatic aortic (valve) stenosis: Secondary | ICD-10-CM | POA: Diagnosis not present

## 2023-02-24 DIAGNOSIS — G609 Hereditary and idiopathic neuropathy, unspecified: Secondary | ICD-10-CM | POA: Diagnosis not present

## 2023-02-24 DIAGNOSIS — E039 Hypothyroidism, unspecified: Secondary | ICD-10-CM | POA: Diagnosis not present

## 2023-02-24 DIAGNOSIS — I25118 Atherosclerotic heart disease of native coronary artery with other forms of angina pectoris: Secondary | ICD-10-CM | POA: Diagnosis not present

## 2023-02-24 DIAGNOSIS — R32 Unspecified urinary incontinence: Secondary | ICD-10-CM | POA: Diagnosis not present

## 2023-02-24 DIAGNOSIS — R7301 Impaired fasting glucose: Secondary | ICD-10-CM | POA: Diagnosis not present

## 2023-02-24 DIAGNOSIS — E782 Mixed hyperlipidemia: Secondary | ICD-10-CM | POA: Diagnosis not present

## 2023-02-24 DIAGNOSIS — I10 Essential (primary) hypertension: Secondary | ICD-10-CM | POA: Diagnosis not present

## 2023-02-24 DIAGNOSIS — E559 Vitamin D deficiency, unspecified: Secondary | ICD-10-CM | POA: Diagnosis not present

## 2023-02-24 DIAGNOSIS — Z8673 Personal history of transient ischemic attack (TIA), and cerebral infarction without residual deficits: Secondary | ICD-10-CM | POA: Diagnosis not present

## 2023-02-24 DIAGNOSIS — R197 Diarrhea, unspecified: Secondary | ICD-10-CM | POA: Diagnosis not present

## 2023-02-25 LAB — LAB REPORT - SCANNED
A1c: 5.6
EGFR (Non-African Amer.): 61

## 2023-03-24 ENCOUNTER — Encounter: Payer: Self-pay | Admitting: Cardiology

## 2023-03-24 ENCOUNTER — Ambulatory Visit: Payer: Medicare Other | Attending: Cardiology | Admitting: Cardiology

## 2023-03-24 VITALS — BP 139/67 | HR 51 | Temp 97.8°F | Ht 59.0 in | Wt 119.0 lb

## 2023-03-24 DIAGNOSIS — I2510 Atherosclerotic heart disease of native coronary artery without angina pectoris: Secondary | ICD-10-CM

## 2023-03-24 DIAGNOSIS — I350 Nonrheumatic aortic (valve) stenosis: Secondary | ICD-10-CM | POA: Insufficient documentation

## 2023-03-24 DIAGNOSIS — E785 Hyperlipidemia, unspecified: Secondary | ICD-10-CM | POA: Insufficient documentation

## 2023-03-24 DIAGNOSIS — I10 Essential (primary) hypertension: Secondary | ICD-10-CM | POA: Insufficient documentation

## 2023-03-24 NOTE — Patient Instructions (Signed)
Medication Instructions:  Your physician recommends that you continue on your current medications as directed. Please refer to the Current Medication list given to you today.  *If you need a refill on your cardiac medications before your next appointment, please call your pharmacy*   Lab Work: None.  If you have labs (blood work) drawn today and your tests are completely normal, you will receive your results only by: MyChart Message (if you have MyChart) OR A paper copy in the mail If you have any lab test that is abnormal or we need to change your treatment, we will call you to review the results.   Testing/Procedures: None.   Follow-Up: At [facility], you and your health needs are our priority.  As part of our continuing mission to provide you with exceptional heart care, we have created designated Provider Care Teams.  These Care Teams include your primary Cardiologist (physician) and Advanced Practice Providers (APPs -  Physician Assistants and Nurse Practitioners) who all work together to provide you with the care you need, when you need it.  We recommend signing up for the patient portal called "MyChart".  Sign up information is provided on this After Visit Summary.  MyChart is used to connect with patients for Virtual Visits (Telemedicine).  Patients are able to view lab/test results, encounter notes, upcoming appointments, etc.  Non-urgent messages can be sent to your provider as well.   To learn more about what you can do with MyChart, go to [email or web address].    Your next appointment:   1 year(s)  Provider:   Traci Turner, MD     

## 2023-03-24 NOTE — Progress Notes (Signed)
Virtual Visit via Video Note   This visit type was conducted due to national recommendations for restrictions regarding the COVID-19 Pandemic (e.g. social distancing) in an effort to limit this patient's exposure and mitigate transmission in our community.  Due to her co-morbid illnesses, this patient is at least at moderate risk for complications without adequate follow up.  This format is felt to be most appropriate for this patient at this time.  All issues noted in this document were discussed and addressed.  A limited physical exam was performed with this format.  Please refer to the patient's chart for her consent to telehealth for [facility].     Date:  03/24/2023   ID:  Christine Juarez, DOB 08/26/1920, MRN [id number] The patient was identified using 2 identifiers.  Patient Location: Home Provider Location: Office/Clinic   PCP:  Benita Stabile, MD  Cardiologist:  Armanda Magic, MD  Electrophysiologist:  None   Chief Complaint:  CAD, HTN, HLD  History of Present Illness:     Christine Juarez is a 89 y.o. female with a hx of ASCAD s/p remote CABG, HTN, and dyslipidemia. She is here today for followup and is doing well.  She denies any chest pain or pressure, SOB, DOE, PND, orthopnea, palpitations or syncope.  She has some issues at times with vertigo.  She has very mild edema at times at night that is gone by morning. She is compliant with her meds and is tolerating meds with no SE.    Prior CV studies:   The following studies were reviewed today:  none  Past Medical History:  Diagnosis Date   Aortic stenosis    mild by echo 08/2015   Arthritis    CAD (coronary artery disease), native coronary artery    s/p remote CABG   HTN (hypertension)    Hyperlipidemia    Neuropathy    Thyroid disease    TIA (transient ischemic attack)    hx of   Past Surgical History:  Procedure Laterality Date   APPENDECTOMY     ARTERIAL BYPASS SURGRY     BACK SURGERY     COLONOSCOPY      LIPOMA EXCISION     SKULL FRACTURE ELEVATION     UPPER GASTROINTESTINAL ENDOSCOPY       Current Meds  Medication Sig   Cholecalciferol (VITAMIN D3) 250 MCG (10000 UT) capsule Take 10,000 Units by mouth daily.     Allergies:   Codeine, Dicyclomine hcl, Erythromycin, Lodine [etodolac], Meclizine hcl, Niacin, and Statins   Social History   Tobacco Use   Smoking status: Never   Smokeless tobacco: Never  Vaping Use   Vaping Use: Never used  Substance Use Topics   Alcohol use: No   Drug use: Never     Family Hx: The patient's family history includes Anxiety disorder in her daughter; Arthritis in an other family member; Cancer in an other family member; Colon cancer in her brother and sister; Dementia in her sister; GER disease in her daughter; Hyperlipidemia in her daughter; Hypertension in her son; Ovarian cancer in her sister.  ROS:   Please see the history of present illness.     All other systems reviewed and are negative.   Labs/Other Tests and Data Reviewed:    Recent Labs: No results found for requested labs within last 365 days.   Recent Lipid Panel Lab Results  Component Value Date/Time   CHOL 160 01/27/2015 10:33 AM  TRIG 105.0 01/27/2015 10:33 AM   HDL 56.90 01/27/2015 10:33 AM   CHOLHDL 3 01/27/2015 10:33 AM   LDLCALC 82 01/27/2015 10:33 AM    Wt Readings from Last 3 Encounters:  03/24/23 119 lb (54 kg)  12/03/21 127 lb 8 oz (57.8 kg)  09/09/20 127 lb 9.6 oz (57.9 kg)     Objective:    Vital Signs:  BP 139/67 (BP Location: Left Arm)   Pulse (!) 41   Temp 97.8 F (36.6 C) (Temporal)   Ht 4\' 11"  (1.499 m)   Wt 119 lb (54 kg)   BMI 24.04 kg/m    Well nourished, well developed female in no acute distress. Well appearing, alert and conversant, regular work of breathing,  good skin color  Eyes- anicteric mouth- oral mucosa is pink  neuro- grossly intact skin- no apparent rash or lesions or cyanosis ASSESSMENT & PLAN:    1.  ASCAD -s/p remote  CABG -She denies any anginal sx -Continue prescription drug managed with aspirin 81 mg daily and Zetia 10 mg daily -She is statin intolerant  2.  HTN -BP controlled on exam today -Continue prescription drug management with Norvasc 5 mg daily with as needed refills  3.  Aortic stenosis -mild by echo 2017 with mean AVG -given advanced age will not pursue repeat echo as not a candidate for invasive procedures due to advanced age and asymptomatic  4.  HLD -LDL goal < 70 -I have personally reviewed and interpreted outside labs performed by patient's PCP which showed LDL 142 and HDL 68 on 02/24/2023 -Continue prescription drug Zetia 10 mg daily with as needed refills -she is statin intolerant and due to advanced age PCSK77i not pursued.  Time:   Today, I have spent 20 minutes with the patient with telehealth technology discussing the above problems, reviewing the chart as well as labs.     Medication Adjustments/Labs and Tests Ordered: Current medicines are reviewed at length with the patient today.  Concerns regarding medicines are outlined above.  Tests Ordered: No orders of the defined types were placed in this encounter.  Medication Changes: No orders of the defined types were placed in this encounter.   Disposition:  Follow up in 1 year(s)  Signed, Armanda Magic, MD  03/24/2023 3:29 PM    [facility]

## 2023-03-28 ENCOUNTER — Other Ambulatory Visit: Payer: Self-pay | Admitting: Cardiology

## 2023-03-28 MED ORDER — EZETIMIBE 10 MG PO TABS: 10.0000 mg | ORAL_TABLET | Freq: Every day | ORAL | 3 refills | Status: DC

## 2023-04-11 ENCOUNTER — Other Ambulatory Visit: Payer: Self-pay | Admitting: Cardiology

## 2023-08-25 DIAGNOSIS — I25118 Atherosclerotic heart disease of native coronary artery with other forms of angina pectoris: Secondary | ICD-10-CM | POA: Diagnosis not present

## 2023-08-25 DIAGNOSIS — E559 Vitamin D deficiency, unspecified: Secondary | ICD-10-CM | POA: Diagnosis not present

## 2023-08-25 DIAGNOSIS — I10 Essential (primary) hypertension: Secondary | ICD-10-CM | POA: Diagnosis not present

## 2023-08-25 DIAGNOSIS — R32 Unspecified urinary incontinence: Secondary | ICD-10-CM | POA: Diagnosis not present

## 2023-08-25 DIAGNOSIS — G609 Hereditary and idiopathic neuropathy, unspecified: Secondary | ICD-10-CM | POA: Diagnosis not present

## 2023-08-25 DIAGNOSIS — I350 Nonrheumatic aortic (valve) stenosis: Secondary | ICD-10-CM | POA: Diagnosis not present

## 2023-08-25 DIAGNOSIS — E039 Hypothyroidism, unspecified: Secondary | ICD-10-CM | POA: Diagnosis not present

## 2023-08-25 DIAGNOSIS — R197 Diarrhea, unspecified: Secondary | ICD-10-CM | POA: Diagnosis not present

## 2023-08-25 DIAGNOSIS — R7301 Impaired fasting glucose: Secondary | ICD-10-CM | POA: Diagnosis not present

## 2023-08-25 DIAGNOSIS — E782 Mixed hyperlipidemia: Secondary | ICD-10-CM | POA: Diagnosis not present

## 2023-08-25 DIAGNOSIS — Z8673 Personal history of transient ischemic attack (TIA), and cerebral infarction without residual deficits: Secondary | ICD-10-CM | POA: Diagnosis not present

## 2023-08-25 DIAGNOSIS — R443 Hallucinations, unspecified: Secondary | ICD-10-CM | POA: Diagnosis not present

## 2024-02-23 DIAGNOSIS — I350 Nonrheumatic aortic (valve) stenosis: Secondary | ICD-10-CM | POA: Diagnosis not present

## 2024-02-23 DIAGNOSIS — R32 Unspecified urinary incontinence: Secondary | ICD-10-CM | POA: Diagnosis not present

## 2024-02-23 DIAGNOSIS — R443 Hallucinations, unspecified: Secondary | ICD-10-CM | POA: Diagnosis not present

## 2024-02-23 DIAGNOSIS — I10 Essential (primary) hypertension: Secondary | ICD-10-CM | POA: Diagnosis not present

## 2024-02-23 DIAGNOSIS — Z8673 Personal history of transient ischemic attack (TIA), and cerebral infarction without residual deficits: Secondary | ICD-10-CM | POA: Diagnosis not present

## 2024-02-23 DIAGNOSIS — E039 Hypothyroidism, unspecified: Secondary | ICD-10-CM | POA: Diagnosis not present

## 2024-02-23 DIAGNOSIS — E559 Vitamin D deficiency, unspecified: Secondary | ICD-10-CM | POA: Diagnosis not present

## 2024-02-23 DIAGNOSIS — E782 Mixed hyperlipidemia: Secondary | ICD-10-CM | POA: Diagnosis not present

## 2024-02-23 DIAGNOSIS — Z79899 Other long term (current) drug therapy: Secondary | ICD-10-CM | POA: Diagnosis not present

## 2024-02-23 DIAGNOSIS — G609 Hereditary and idiopathic neuropathy, unspecified: Secondary | ICD-10-CM | POA: Diagnosis not present

## 2024-02-23 DIAGNOSIS — R7301 Impaired fasting glucose: Secondary | ICD-10-CM | POA: Diagnosis not present

## 2024-02-23 DIAGNOSIS — I25118 Atherosclerotic heart disease of native coronary artery with other forms of angina pectoris: Secondary | ICD-10-CM | POA: Diagnosis not present

## 2024-05-16 ENCOUNTER — Other Ambulatory Visit: Payer: Self-pay

## 2024-05-16 ENCOUNTER — Emergency Department (HOSPITAL_COMMUNITY)

## 2024-05-16 ENCOUNTER — Encounter (HOSPITAL_COMMUNITY): Payer: Self-pay | Admitting: Internal Medicine

## 2024-05-16 ENCOUNTER — Inpatient Hospital Stay (HOSPITAL_COMMUNITY)
Admission: EM | Admit: 2024-05-16 | Discharge: 2024-06-18 | DRG: 391 | Disposition: E | Attending: Internal Medicine | Admitting: Internal Medicine

## 2024-05-16 DIAGNOSIS — A419 Sepsis, unspecified organism: Secondary | ICD-10-CM | POA: Diagnosis not present

## 2024-05-16 DIAGNOSIS — Z515 Encounter for palliative care: Secondary | ICD-10-CM

## 2024-05-16 DIAGNOSIS — R197 Diarrhea, unspecified: Secondary | ICD-10-CM | POA: Diagnosis not present

## 2024-05-16 DIAGNOSIS — Z79890 Hormone replacement therapy (postmenopausal): Secondary | ICD-10-CM

## 2024-05-16 DIAGNOSIS — Z951 Presence of aortocoronary bypass graft: Secondary | ICD-10-CM

## 2024-05-16 DIAGNOSIS — Z885 Allergy status to narcotic agent status: Secondary | ICD-10-CM

## 2024-05-16 DIAGNOSIS — R110 Nausea: Secondary | ICD-10-CM | POA: Diagnosis not present

## 2024-05-16 DIAGNOSIS — Z8041 Family history of malignant neoplasm of ovary: Secondary | ICD-10-CM

## 2024-05-16 DIAGNOSIS — K5900 Constipation, unspecified: Principal | ICD-10-CM | POA: Diagnosis present

## 2024-05-16 DIAGNOSIS — E876 Hypokalemia: Secondary | ICD-10-CM | POA: Diagnosis present

## 2024-05-16 DIAGNOSIS — J690 Pneumonitis due to inhalation of food and vomit: Secondary | ICD-10-CM | POA: Diagnosis not present

## 2024-05-16 DIAGNOSIS — I10 Essential (primary) hypertension: Secondary | ICD-10-CM | POA: Diagnosis present

## 2024-05-16 DIAGNOSIS — Z66 Do not resuscitate: Secondary | ICD-10-CM | POA: Diagnosis present

## 2024-05-16 DIAGNOSIS — J189 Pneumonia, unspecified organism: Secondary | ICD-10-CM | POA: Diagnosis not present

## 2024-05-16 DIAGNOSIS — R1013 Epigastric pain: Secondary | ICD-10-CM | POA: Diagnosis not present

## 2024-05-16 DIAGNOSIS — J984 Other disorders of lung: Secondary | ICD-10-CM | POA: Diagnosis not present

## 2024-05-16 DIAGNOSIS — K219 Gastro-esophageal reflux disease without esophagitis: Secondary | ICD-10-CM | POA: Diagnosis present

## 2024-05-16 DIAGNOSIS — I2510 Atherosclerotic heart disease of native coronary artery without angina pectoris: Secondary | ICD-10-CM | POA: Diagnosis not present

## 2024-05-16 DIAGNOSIS — Z79899 Other long term (current) drug therapy: Secondary | ICD-10-CM | POA: Diagnosis not present

## 2024-05-16 DIAGNOSIS — Z7189 Other specified counseling: Secondary | ICD-10-CM | POA: Diagnosis not present

## 2024-05-16 DIAGNOSIS — Z7982 Long term (current) use of aspirin: Secondary | ICD-10-CM

## 2024-05-16 DIAGNOSIS — Z820 Family history of epilepsy and other diseases of the nervous system: Secondary | ICD-10-CM

## 2024-05-16 DIAGNOSIS — Z8249 Family history of ischemic heart disease and other diseases of the circulatory system: Secondary | ICD-10-CM

## 2024-05-16 DIAGNOSIS — Z818 Family history of other mental and behavioral disorders: Secondary | ICD-10-CM

## 2024-05-16 DIAGNOSIS — K5730 Diverticulosis of large intestine without perforation or abscess without bleeding: Secondary | ICD-10-CM | POA: Diagnosis not present

## 2024-05-16 DIAGNOSIS — T68XXXA Hypothermia, initial encounter: Secondary | ICD-10-CM | POA: Diagnosis present

## 2024-05-16 DIAGNOSIS — Z9049 Acquired absence of other specified parts of digestive tract: Secondary | ICD-10-CM

## 2024-05-16 DIAGNOSIS — E785 Hyperlipidemia, unspecified: Secondary | ICD-10-CM | POA: Diagnosis present

## 2024-05-16 DIAGNOSIS — K449 Diaphragmatic hernia without obstruction or gangrene: Secondary | ICD-10-CM | POA: Diagnosis not present

## 2024-05-16 DIAGNOSIS — Z881 Allergy status to other antibiotic agents status: Secondary | ICD-10-CM | POA: Diagnosis not present

## 2024-05-16 DIAGNOSIS — K529 Noninfective gastroenteritis and colitis, unspecified: Secondary | ICD-10-CM | POA: Diagnosis present

## 2024-05-16 DIAGNOSIS — R0682 Tachypnea, not elsewhere classified: Secondary | ICD-10-CM | POA: Diagnosis not present

## 2024-05-16 DIAGNOSIS — K5904 Chronic idiopathic constipation: Secondary | ICD-10-CM | POA: Diagnosis not present

## 2024-05-16 DIAGNOSIS — R54 Age-related physical debility: Secondary | ICD-10-CM | POA: Diagnosis present

## 2024-05-16 DIAGNOSIS — G629 Polyneuropathy, unspecified: Secondary | ICD-10-CM | POA: Diagnosis present

## 2024-05-16 DIAGNOSIS — R918 Other nonspecific abnormal finding of lung field: Secondary | ICD-10-CM | POA: Diagnosis not present

## 2024-05-16 DIAGNOSIS — K4090 Unilateral inguinal hernia, without obstruction or gangrene, not specified as recurrent: Secondary | ICD-10-CM | POA: Diagnosis not present

## 2024-05-16 DIAGNOSIS — I213 ST elevation (STEMI) myocardial infarction of unspecified site: Secondary | ICD-10-CM | POA: Diagnosis not present

## 2024-05-16 DIAGNOSIS — Z888 Allergy status to other drugs, medicaments and biological substances status: Secondary | ICD-10-CM

## 2024-05-16 DIAGNOSIS — R1084 Generalized abdominal pain: Secondary | ICD-10-CM | POA: Diagnosis not present

## 2024-05-16 DIAGNOSIS — E039 Hypothyroidism, unspecified: Secondary | ICD-10-CM | POA: Diagnosis present

## 2024-05-16 DIAGNOSIS — R932 Abnormal findings on diagnostic imaging of liver and biliary tract: Secondary | ICD-10-CM | POA: Diagnosis not present

## 2024-05-16 DIAGNOSIS — Z800 Family history of malignant neoplasm of digestive organs: Secondary | ICD-10-CM

## 2024-05-16 DIAGNOSIS — Z8673 Personal history of transient ischemic attack (TIA), and cerebral infarction without residual deficits: Secondary | ICD-10-CM

## 2024-05-16 DIAGNOSIS — R112 Nausea with vomiting, unspecified: Secondary | ICD-10-CM

## 2024-05-16 DIAGNOSIS — I350 Nonrheumatic aortic (valve) stenosis: Secondary | ICD-10-CM | POA: Diagnosis not present

## 2024-05-16 DIAGNOSIS — R531 Weakness: Secondary | ICD-10-CM | POA: Diagnosis not present

## 2024-05-16 LAB — COMPREHENSIVE METABOLIC PANEL WITH GFR
ALT: 15 U/L (ref 0–44)
AST: 22 U/L (ref 15–41)
Albumin: 3.7 g/dL (ref 3.5–5.0)
Alkaline Phosphatase: 102 U/L (ref 38–126)
Anion gap: 13 (ref 5–15)
BUN: 27 mg/dL — ABNORMAL HIGH (ref 8–23)
CO2: 19 mmol/L — ABNORMAL LOW (ref 22–32)
Calcium: 9.6 mg/dL (ref 8.9–10.3)
Chloride: 107 mmol/L (ref 98–111)
Creatinine, Ser: 0.88 mg/dL (ref 0.44–1.00)
GFR, Estimated: 58 mL/min — ABNORMAL LOW (ref >60)
Glucose, Bld: 189 mg/dL — ABNORMAL HIGH (ref 70–99)
Potassium: 3.2 mmol/L — ABNORMAL LOW (ref 3.5–5.1)
Sodium: 139 mmol/L (ref 135–145)
Total Bilirubin: 0.7 mg/dL (ref 0.0–1.2)
Total Protein: 6.7 g/dL (ref 6.5–8.1)

## 2024-05-16 LAB — URINALYSIS, ROUTINE W REFLEX MICROSCOPIC
Bacteria, UA: NONE SEEN
Bilirubin Urine: NEGATIVE
Glucose, UA: >=500 mg/dL — AB
Ketones, ur: NEGATIVE mg/dL
Leukocytes,Ua: NEGATIVE
Nitrite: NEGATIVE
Protein, ur: NEGATIVE mg/dL
Specific Gravity, Urine: 1.02 (ref 1.005–1.030)
pH: 5 (ref 5.0–8.0)

## 2024-05-16 LAB — TSH: TSH: 1.278 u[IU]/mL (ref 0.350–4.500)

## 2024-05-16 LAB — CBC
HCT: 43.5 % (ref 36.0–46.0)
Hemoglobin: 14.7 g/dL (ref 12.0–15.0)
MCH: 33 pg (ref 26.0–34.0)
MCHC: 33.8 g/dL (ref 30.0–36.0)
MCV: 97.8 fL (ref 80.0–100.0)
Platelets: 220 K/uL (ref 150–400)
RBC: 4.45 MIL/uL (ref 3.87–5.11)
RDW: 11.9 % (ref 11.5–15.5)
WBC: 14.4 K/uL — ABNORMAL HIGH (ref 4.0–10.5)
nRBC: 0 % (ref 0.0–0.2)

## 2024-05-16 LAB — MRSA NEXT GEN BY PCR, NASAL: MRSA by PCR Next Gen: NOT DETECTED

## 2024-05-16 LAB — LIPASE, BLOOD: Lipase: 39 U/L (ref 11–51)

## 2024-05-16 LAB — PROCALCITONIN: Procalcitonin: 0.19 ng/mL

## 2024-05-16 MED ORDER — CHLORHEXIDINE GLUCONATE CLOTH 2 % EX PADS
6.0000 | MEDICATED_PAD | Freq: Every day | CUTANEOUS | Status: DC
  Administered 2024-05-16 – 2024-05-18 (×3): 6 via TOPICAL

## 2024-05-16 MED ORDER — LACTATED RINGERS IV SOLN: INTRAVENOUS | Status: AC

## 2024-05-16 MED ORDER — ONDANSETRON HCL 4 MG/2ML IJ SOLN
4.0000 mg | Freq: Once | INTRAMUSCULAR | Status: AC
  Administered 2024-05-16: 4 mg via INTRAVENOUS
  Filled 2024-05-16: qty 2

## 2024-05-16 MED ORDER — MAGNESIUM CITRATE PO SOLN
1.0000 | Freq: Once | ORAL | Status: DC
  Filled 2024-05-16: qty 296

## 2024-05-16 MED ORDER — ENOXAPARIN SODIUM 40 MG/0.4ML IJ SOSY
40.0000 mg | PREFILLED_SYRINGE | INTRAMUSCULAR | Status: DC
  Administered 2024-05-16: 40 mg via SUBCUTANEOUS
  Filled 2024-05-16: qty 0.4

## 2024-05-16 MED ORDER — PROCHLORPERAZINE EDISYLATE 10 MG/2ML IJ SOLN
10.0000 mg | Freq: Four times a day (QID) | INTRAMUSCULAR | Status: DC | PRN
  Administered 2024-05-16: 10 mg via INTRAVENOUS
  Filled 2024-05-16 (×2): qty 2

## 2024-05-16 MED ORDER — ASPIRIN 81 MG PO CHEW
81.0000 mg | CHEWABLE_TABLET | Freq: Every day | ORAL | Status: DC
  Filled 2024-05-16: qty 1

## 2024-05-16 MED ORDER — MORPHINE SULFATE (PF) 2 MG/ML IV SOLN
2.0000 mg | Freq: Once | INTRAVENOUS | Status: DC
  Filled 2024-05-16: qty 1

## 2024-05-16 MED ORDER — LEVOTHYROXINE SODIUM 75 MCG PO TABS
75.0000 ug | ORAL_TABLET | Freq: Every day | ORAL | Status: DC
  Filled 2024-05-16: qty 1

## 2024-05-16 MED ORDER — ONDANSETRON HCL 4 MG/2ML IJ SOLN
4.0000 mg | Freq: Four times a day (QID) | INTRAMUSCULAR | Status: DC | PRN
  Administered 2024-05-16: 4 mg via INTRAVENOUS
  Filled 2024-05-16: qty 2

## 2024-05-16 MED ORDER — PANTOPRAZOLE SODIUM 40 MG PO TBEC
40.0000 mg | DELAYED_RELEASE_TABLET | Freq: Every day | ORAL | Status: DC
  Administered 2024-05-16: 40 mg via ORAL
  Filled 2024-05-16 (×2): qty 1

## 2024-05-16 MED ORDER — SODIUM CHLORIDE 0.9 % IV SOLN
2.0000 g | INTRAVENOUS | Status: DC
  Administered 2024-05-16: 2 g via INTRAVENOUS
  Filled 2024-05-16: qty 20

## 2024-05-16 MED ORDER — IOHEXOL 300 MG/ML  SOLN
100.0000 mL | Freq: Once | INTRAMUSCULAR | Status: AC | PRN
  Administered 2024-05-16: 100 mL via INTRAVENOUS

## 2024-05-16 MED ORDER — LEVOTHYROXINE SODIUM 50 MCG PO TABS
75.0000 ug | ORAL_TABLET | Freq: Every day | ORAL | Status: DC
  Filled 2024-05-16: qty 2

## 2024-05-16 MED ORDER — AMLODIPINE BESYLATE 5 MG PO TABS
5.0000 mg | ORAL_TABLET | Freq: Every day | ORAL | Status: DC
  Administered 2024-05-16: 5 mg via ORAL
  Filled 2024-05-16 (×2): qty 1

## 2024-05-16 MED ORDER — SODIUM CHLORIDE 0.9 % IV BOLUS
500.0000 mL | Freq: Once | INTRAVENOUS | Status: AC
  Administered 2024-05-16: 500 mL via INTRAVENOUS

## 2024-05-16 MED ORDER — POTASSIUM CHLORIDE 10 MEQ/100ML IV SOLN
10.0000 meq | INTRAVENOUS | Status: AC
  Administered 2024-05-16 (×4): 10 meq via INTRAVENOUS
  Filled 2024-05-16 (×4): qty 100

## 2024-05-16 MED ORDER — ONDANSETRON HCL 4 MG PO TABS: 4.0000 mg | ORAL_TABLET | Freq: Four times a day (QID) | ORAL | Status: DC | PRN

## 2024-05-16 MED ORDER — POLYETHYLENE GLYCOL 3350 17 G PO PACK
17.0000 g | PACK | Freq: Two times a day (BID) | ORAL | Status: DC
  Filled 2024-05-16 (×2): qty 1

## 2024-05-16 MED ORDER — ACETAMINOPHEN 325 MG PO TABS
650.0000 mg | ORAL_TABLET | Freq: Four times a day (QID) | ORAL | Status: DC | PRN
Start: 2024-05-16 — End: 2024-05-18
  Administered 2024-05-16 (×2): 650 mg via ORAL
  Filled 2024-05-16 (×2): qty 2

## 2024-05-16 MED ORDER — ACETAMINOPHEN 650 MG RE SUPP
650.0000 mg | Freq: Four times a day (QID) | RECTAL | Status: DC | PRN
Start: 2024-05-16 — End: 2024-05-18

## 2024-05-16 MED ORDER — SENNA 8.6 MG PO TABS
2.0000 | ORAL_TABLET | Freq: Once | ORAL | Status: DC
  Filled 2024-05-16: qty 2

## 2024-05-16 MED ORDER — HYDRALAZINE HCL 20 MG/ML IJ SOLN: 10.0000 mg | INTRAMUSCULAR | Status: DC | PRN

## 2024-05-16 MED ORDER — LEVOTHYROXINE SODIUM 88 MCG PO TABS: 88.0000 ug | ORAL_TABLET | Freq: Every day | ORAL | Status: DC

## 2024-05-16 NOTE — Plan of Care (Signed)

## 2024-05-16 NOTE — H&P (Signed)
 History and Physical    Christine Juarez FMW:[id number] DOB: 21-Nov-1919 DOA: 05/16/2024  PCP: Christine Norleen PEDLAR, MD   Chief Complaint: Nausea vomiting abdominal pain  HPI: Christine Juarez is a 89 y.o. female with medical history significant of GERD, CAD status post CABG, hypothyroidism who presented to the emergency department due to severe abdominal pain.  Patient went to bed and then woke up with nausea vomiting and abdominal cramping.  Her daughter brought her to the emergency department further assessment.  She states that she had intermittent issues with periods of constipation followed by diarrhea and occasionally takes Imodium.  She presented to the ER where she was found to be afebrile and hemodynamically stable.  Labs were obtained in the emergency department which demonstrated lipase 39, potassium 3.2, WBC 14.4, hemoglobin 14.7.  Patient underwent CT abdomen pelvis which showed no acute pathology.   Review of Systems: Review of Systems  Constitutional:  Negative for chills and fever.  HENT: Negative.    Eyes: Negative.   Respiratory: Negative.    Cardiovascular: Negative.   Gastrointestinal:  Positive for abdominal pain, nausea and vomiting.  Genitourinary: Negative.   Musculoskeletal: Negative.   Skin: Negative.   Neurological: Negative.   Psychiatric/Behavioral: Negative.    All other systems reviewed and are negative.    As per HPI otherwise 10 point review of systems negative.   Allergies  Allergen Reactions   Codeine     UNKNOWN   Dicyclomine Hcl     UNKNOWN   Erythromycin     UNKNOWN   Lodine [Etodolac]    Meclizine  Hcl     amnesia   Niacin     UNKNOWN   Statins     Muscle pain    Past Medical History:  Diagnosis Date   Aortic stenosis    mild by echo 08/2015   Arthritis    CAD (coronary artery disease), native coronary artery    s/p remote CABG   HTN (hypertension)    Hyperlipidemia    Neuropathy    Thyroid  disease    TIA (transient ischemic attack)     hx of    Past Surgical History:  Procedure Laterality Date   APPENDECTOMY     ARTERIAL BYPASS SURGRY     BACK SURGERY     COLONOSCOPY     LIPOMA EXCISION     SKULL FRACTURE ELEVATION     UPPER GASTROINTESTINAL ENDOSCOPY       reports that she has never smoked. She has never used smokeless tobacco. She reports that she does not drink alcohol  and does not use drugs.  Family History  Problem Relation Age of Onset   Ovarian cancer Sister    Colon cancer Sister    Dementia Sister    GER disease Daughter    Anxiety disorder Daughter    Hyperlipidemia Daughter    Hypertension Son    Arthritis Other    Cancer Other    Colon cancer Brother     Prior to Admission medications   Medication Sig Start Date End Date Taking? Authorizing Provider  acetaminophen  (TYLENOL ) 500 MG tablet Take 1,000 mg by mouth every 6 (six) hours as needed.    [provider]  aspirin  81 MG chewable tablet Chew 81 mg by mouth daily. Before bed    [provider]  aspirin  81 MG tablet Take 81 mg by mouth daily.      [provider]  Cholecalciferol (VITAMIN D3) 250 MCG (10000  UT) capsule Take 10,000 Units by mouth daily.    [provider]  Coenzyme Q10 (COQ10) 100 MG CAPS Take 1 capsule by mouth daily.     [provider]  Cyanocobalamin  (VITAMIN B-12 SL) Place under the tongue daily.    [provider]  doxycycline  (VIBRAMYCIN ) 100 MG capsule Take 1 capsule (100 mg total) by mouth 2 (two) times daily. Patient not taking: Reported on 12/03/2021 07/20/21   Zackowski, Scott, MD  esomeprazole (NEXIUM) 20 MG capsule Take 20 mg by mouth daily at 12 noon.    [provider]  ezetimibe  (ZETIA ) 10 MG tablet Take 1 tablet (10 mg total) by mouth daily. for cholesterol. 03/28/23   Shlomo Wilbert SAUNDERS, MD  NORVASC  5 MG tablet Take 1 tablet (5 mg total) by mouth daily. 04/11/23   Shlomo Wilbert SAUNDERS, MD  SYNTHROID  100 MCG tablet Take 100 mcg by mouth daily. Patient not  taking: Reported on 12/03/2021 06/27/20   [provider]  SYNTHROID  88 MCG tablet Take 88 mcg by mouth daily. 04/30/21   [provider]    Physical Exam: Vitals:   05/16/24 0119 05/16/24 0130 05/16/24 0200 05/16/24 0437  BP:  (!) 185/102 (!) 186/84 (!) 179/83  Pulse: 77 75 79 86  Resp:   15 19  Temp:      TempSrc:      SpO2: 96% 96% 98% 96%   Physical Exam Constitutional:      Appearance: She is normal weight.  HENT:     Head: Normocephalic.     Nose: Nose normal.     Mouth/Throat:     Mouth: Mucous membranes are moist.     Pharynx: Oropharynx is clear.  Eyes:     Conjunctiva/sclera: Conjunctivae normal.     Pupils: Pupils are equal, round, and reactive to light.  Cardiovascular:     Rate and Rhythm: Normal rate and regular rhythm.     Pulses: Normal pulses.     Heart sounds: Normal heart sounds.  Pulmonary:     Effort: Pulmonary effort is normal.     Breath sounds: Normal breath sounds.  Abdominal:     General: Abdomen is flat.  Musculoskeletal:        General: Normal range of motion.     Cervical back: Normal range of motion.  Skin:    General: Skin is warm.     Capillary Refill: Capillary refill takes less than 2 seconds.  Neurological:     General: No focal deficit present.     Mental Status: She is alert.  Psychiatric:        Mood and Affect: Mood normal.       Labs on Admission: I have personally reviewed the patients's labs and imaging studies.  Assessment/Plan Principal Problem:   Constipation   # Abdominal pain most likely secondary to severe constipation - CT scan with moderate stool burden - Patient's had episodes of constipation with subsequent diarrhea that she takes Imodium for.  Her diarrhea is likely related to overflow  Plan: Start MiraLAX  Administer magnesium  citrate Scheduled Senokot Hydrate with IV fluids next  # Hypothyroidism-check TSH  # Hypokalemia-replete electrolyte as  # Hypertension-continue  amlodipine   # CAD-continue aspirin    Admission status: Inpatient Med-Surg  Certification: The appropriate patient status for this patient is INPATIENT. Inpatient status is judged to be reasonable and necessary in order to provide the required intensity of service to ensure the patient's safety. The patient's presenting symptoms, physical exam  findings, and initial radiographic and laboratory data in the context of their chronic comorbidities is felt to place them at high risk for further clinical deterioration. Furthermore, it is not anticipated that the patient will be medically stable for discharge from the hospital within 2 midnights of admission.   * I certify that at the point of admission it is my clinical judgment that the patient will require inpatient hospital care spanning beyond 2 midnights from the point of admission due to high intensity of service, high risk for further deterioration and high frequency of surveillance required.DEWAINE Lamar Dess MD [facility] If 7PM-7AM, please contact night-coverage [email or web address]  05/16/2024, 5:57 AM

## 2024-05-16 NOTE — Progress Notes (Signed)
 PROGRESS NOTE    [name]  FMW:[id number] DOB: [birth year] DOA: 05/16/2024 PCP: Shona Norleen PEDLAR, MD   Brief Narrative:    Christine Juarez is a 89 y.o. female with medical history significant of GERD, CAD status post CABG, hypothyroidism who presented to the emergency department due to severe abdominal pain.  Patient went to bed and then woke up with nausea vomiting and abdominal cramping.  It appears she may have some gastroenteritis, but is now hypothermic and has been started on [employer]  Assessment & Plan:   Principal Problem:   Constipation Active Problems:   Hypothermia  Assessment and Plan:   Abdominal pain with nausea and vomiting likely secondary to acute gastroenteritis - CT scan with moderate stool burden, but no other acute findings - Patient's had episodes of constipation with subsequent diarrhea that she takes Imodium for - Hold laxation -Continue IV fluid for now and clear liquid diet and advance as tolerated  Hypothermia -Bair hugger -Blood cultures -Noted to have leukocytosis, no sign of UTI, check chest x-ray -Start IV Rocephin  empirically and check procalcitonin   # Hypothyroidism-TSH 1.28 -Resume home Synthroid  dose 75 mcg daily   # Hypokalemia-improving -Continue repletion and likely related to GI losses   # Hypertension-uncontrolled  -continue amlodipine  -IV hydralazine  as needed   # CAD-continue aspirin    DVT prophylaxis: Lovenox  Code Status: DNR Family Communication: Daughter at bedside 7/30 Disposition Plan:  Status is: Inpatient Remains inpatient appropriate because: Need for IV fluids and increased intensity with IV antiemetic use.   Consultants:  None  Procedures:  None  Antimicrobials:  None   Subjective: Patient seen and evaluated today with ongoing nausea and epigastric abdominal pain, but with no further vomiting or diarrhea.  Now noted to be hypothermic and requiring Lawyer.  Objective: Vitals:   05/16/24  1024 05/16/24 1036 05/16/24 1100 05/16/24 1123  BP:  (!) 178/90 (!) 180/75   Pulse:  100 100   Resp:  15 20   Temp: (!) 95.2 F (35.1 C)   (!) 95.8 F (35.4 C)  TempSrc: Rectal   Rectal  SpO2:  97% 97%   Weight: 53.8 kg     Height: 5' 3 (1.6 m)       Intake/Output Summary (Last 24 hours) at 05/16/2024 1227 Last data filed at 05/16/2024 0402 Gross per 24 hour  Intake 500 ml  Output --  Net 500 ml   Filed Weights   05/16/24 1024  Weight: 53.8 kg    Examination:  General exam: Appears calm and comfortable, frail/elderly Respiratory system: Clear to auscultation. Respiratory effort normal. Cardiovascular system: S1 & S2 heard, RRR.  Gastrointestinal system: Abdomen is soft, tender to palpation over epigastric region Central nervous system: Alert and awake Extremities: No edema Skin: No significant lesions noted Psychiatry: Flat affect.    Data Reviewed: I have personally reviewed following labs and imaging studies  CBC: Recent Labs  Lab 05/16/24 0129  WBC 14.4*  HGB 14.7  HCT 43.5  MCV 97.8  PLT 220   Basic Metabolic Panel: Recent Labs  Lab 05/16/24 0129  NA 139  K 3.2*  CL 107  CO2 19*  GLUCOSE 189*  BUN 27*  CREATININE 0.88  CALCIUM  9.6   GFR: Estimated Creatinine Clearance: 26.7 mL/min (by C-G formula based on SCr of 0.88 mg/dL). Liver Function Tests: Recent Labs  Lab 05/16/24 0129  AST 22  ALT 15  ALKPHOS 102  BILITOT 0.7  PROT 6.7  ALBUMIN  3.7   Recent Labs  Lab 05/16/24 0129  LIPASE 39   No results for input(s): AMMONIA in the last 168 hours. Coagulation Profile: No results for input(s): INR, PROTIME in the last 168 hours. Cardiac Enzymes: No results for input(s): CKTOTAL, CKMB, CKMBINDEX, TROPONINI in the last 168 hours. BNP (last 3 results) No results for input(s): PROBNP in the last 8760 hours. HbA1C: No results for input(s): HGBA1C in the last 72 hours. CBG: No results for input(s): GLUCAP in the last  168 hours. Lipid Profile: No results for input(s): CHOL, HDL, LDLCALC, TRIG, CHOLHDL, LDLDIRECT in the last 72 hours. Thyroid  Function Tests: Recent Labs    05/16/24 0129  TSH 1.278   Anemia Panel: No results for input(s): VITAMINB12, FOLATE, FERRITIN, TIBC, IRON, RETICCTPCT in the last 72 hours. Sepsis Labs: No results for input(s): PROCALCITON, LATICACIDVEN in the last 168 hours.  No results found for this or any previous visit (from the past 240 hours).       Radiology Studies: CT ABDOMEN PELVIS W CONTRAST Result Date: 05/16/2024 CLINICAL DATA:  Acute nonlocalized abdominal pain, nausea, diarrhea EXAM: CT ABDOMEN AND PELVIS WITH CONTRAST TECHNIQUE: Multidetector CT imaging of the abdomen and pelvis was performed using the standard protocol following bolus administration of intravenous contrast. RADIATION DOSE REDUCTION: This exam was performed according to the departmental dose-optimization program which includes automated exposure control, adjustment of the mA and/or kV according to patient size and/or use of iterative reconstruction technique. CONTRAST:  OMNIPAQUE  IOHEXOL  300 MG/ML  SOLN COMPARISON:  None Available. FINDINGS: Lower chest: Median sternotomy has been performed. Extensive calcification the aortic valve leaflets. Extensive right coronary artery calcification. Global cardiac size is at the upper limits of normal. Small hiatal hernia. Hepatobiliary: No focal liver abnormality is seen. Status post cholecystectomy. No biliary dilatation. Pancreas: Unremarkable Spleen: 17 mm probable splenic hemangioma (13/3). Spleen is otherwise unremarkable. Adrenals/Urinary Tract: Adrenal glands are unremarkable. Kidneys are normal, without renal calculi, focal lesion, or hydronephrosis. Bladder is unremarkable. Stomach/Bowel: Moderate colonic stool burden. Severe sigmoid diverticulosis. Stomach, small bowel, and large bowel are otherwise unremarkable. Appendix  absent. No evidence of obstruction or focal inflammation. No free intraperitoneal gas or fluid. Vascular/Lymphatic: Aortic atherosclerosis. No enlarged abdominal or pelvic lymph nodes. Reproductive: Uterus and bilateral adnexa are unremarkable. Other: Tiny fat containing left inguinal hernia Musculoskeletal: Osseous structures are diffusely osteopenic. Grade 1 anterolisthesis L3-4. Osseous structures are otherwise age-appropriate. No acute bone abnormality. IMPRESSION: 1. No acute intra-abdominal pathology identified. No definite explanation for the patient's reported symptoms. 2. Extensive calcification of the aortic valve leaflets. Echocardiography may be helpful for further evaluation. 3. Extensive right coronary artery calcification. 4. Small hiatal hernia. 5. Severe sigmoid diverticulosis. 6. Moderate colonic stool burden Aortic Atherosclerosis (ICD10-I70.0). Electronically Signed   By: Dorethia Molt M.D.   On: 05/16/2024 03:01        Scheduled Meds:  amLODipine   5 mg Oral Daily   aspirin   81 mg Oral QHS   Chlorhexidine  Gluconate Cloth  6 each Topical Q0600   enoxaparin  (LOVENOX ) injection  40 mg Subcutaneous Q24H   levothyroxine   75 mcg Oral Q0600   magnesium  citrate  1 Bottle Oral Once    morphine  injection  2 mg Intravenous Once   pantoprazole   40 mg Oral Q1200   polyethylene glycol  17 g Oral BID   senna  2 tablet Oral Once   Continuous Infusions:  lactated ringers  100 mL/hr at 05/16/24 1026   potassium chloride  10 mEq (05/16/24  1213)     LOS: 0 days    Time spent: 55 minutes    Holden Draughon JONETTA Fairly, DO [facility]  If 7PM-7AM, please contact night-coverage [email or web address] 05/16/2024, 12:27 PM

## 2024-05-16 NOTE — ED Triage Notes (Signed)
 Pt BIB [facility] from home c/o abdominal pain, nausea and diarrhea. Pt states she feels weak.

## 2024-05-16 NOTE — ED Notes (Signed)
 Patient returned from CT

## 2024-05-16 NOTE — TOC CM/SW Note (Signed)
 Transition of Care [facility]) - Inpatient Brief Assessment   Patient Details  Name: Christine Juarez MRN: [id number] Date of Birth: Aug 21, 1920  Transition of Care [facility]) CM/SW Contact:    Noreen [name] [name] Phone Number: 05/16/2024, 9:32 AM   Clinical Narrative:  Transition of Care Department [facility]) has reviewed patient and no TOC needs have been identified at this time. We will continue to monitor patient advancement through interdisciplinary progression rounds. If new patient transition needs arise, please place a TOC consult.   Transition of Care Asessment: Insurance and Status: Insurance coverage has been reviewed Patient has primary care physician: Yes Home environment has been reviewed: [facility] with daughter Prior level of function:: daughter assist as needed Prior/Current Home Services: No current home services Social Drivers of Health Review: SDOH reviewed no interventions necessary Readmission risk has been reviewed: Yes Transition of care needs: no transition of care needs at this time

## 2024-05-16 NOTE — Progress Notes (Signed)
 Tele sitter monitoring patient at this time.

## 2024-05-16 NOTE — Progress Notes (Signed)
 Patient put on warming blanket. Temperature 95.5.

## 2024-05-16 NOTE — ED Notes (Signed)
 Bair Hugger applied. MD notified

## 2024-05-16 NOTE — ED Notes (Signed)
 Patient taken to CT at this time.

## 2024-05-16 NOTE — ED Provider Notes (Signed)
  EMERGENCY DEPARTMENT AT [facility] Provider Note   CSN: [id number] Arrival date & time: 05/16/24  0107     Patient presents with: Nausea   Christine Juarez is a 89 y.o. female.   Patient is a 89 year old female with past medical history of prior cholecystectomy, GERD, coronary artery disease status post CABG, hypothyroidism.  Patient brought by EMS for evaluation of abdominal discomfort and nausea.  She went to bed this evening feeling fine, then woke up her daughter 10 minutes later stating that she was nauseated and her abdomen was cramping.  She has not vomited, but feels as though she needs to.  No fevers or chills.  Daughter had some diarrhea last week, but otherwise no ill contacts.  No aggravating or alleviating factors.       Prior to Admission medications   Medication Sig Start Date End Date Taking? Authorizing Provider  acetaminophen  (TYLENOL ) 500 MG tablet Take 1,000 mg by mouth every 6 (six) hours as needed.    [provider]  aspirin  81 MG chewable tablet Chew 81 mg by mouth daily. Before bed    [provider]  aspirin  81 MG tablet Take 81 mg by mouth daily.      [provider]  Cholecalciferol (VITAMIN D3) 250 MCG (10000 UT) capsule Take 10,000 Units by mouth daily.    [provider]  Coenzyme Q10 (COQ10) 100 MG CAPS Take 1 capsule by mouth daily.     [provider]  Cyanocobalamin  (VITAMIN B-12 SL) Place under the tongue daily.    [provider]  doxycycline  (VIBRAMYCIN ) 100 MG capsule Take 1 capsule (100 mg total) by mouth 2 (two) times daily. Patient not taking: Reported on 12/03/2021 07/20/21   Zackowski, Scott, MD  esomeprazole (NEXIUM) 20 MG capsule Take 20 mg by mouth daily at 12 noon.    [provider]  ezetimibe  (ZETIA ) 10 MG tablet Take 1 tablet (10 mg total) by mouth daily. for cholesterol. 03/28/23   Shlomo Wilbert SAUNDERS, MD  NORVASC  5 MG tablet Take 1 tablet (5 mg total) by  mouth daily. 04/11/23   Shlomo Wilbert SAUNDERS, MD  SYNTHROID  100 MCG tablet Take 100 mcg by mouth daily. Patient not taking: Reported on 12/03/2021 06/27/20   [provider]  SYNTHROID  88 MCG tablet Take 88 mcg by mouth daily. 04/30/21   [provider]    Allergies: Codeine, Dicyclomine hcl, Erythromycin, Lodine [etodolac], Meclizine  hcl, Niacin, and Statins    Review of Systems  All other systems reviewed and are negative.   Updated Vital Signs BP (!) 185/102   Pulse 75   Temp 97.6 F (36.4 C) (Oral)   SpO2 96%   Physical Exam Vitals and nursing note reviewed.  Constitutional:      General: She is not in acute distress.    Appearance: She is well-developed. She is not diaphoretic.  HENT:     Head: Normocephalic and atraumatic.  Cardiovascular:     Rate and Rhythm: Normal rate and regular rhythm.     Heart sounds: No murmur heard.    No friction rub. No gallop.  Pulmonary:     Effort: Pulmonary effort is normal. No respiratory distress.     Breath sounds: Normal breath sounds. No wheezing.  Abdominal:     General: Bowel sounds are normal. There is no distension.     Palpations: Abdomen is soft.     Tenderness: There is abdominal tenderness. There is no  right CVA tenderness, left CVA tenderness, guarding or rebound.     Comments: There is tenderness to the upper abdomen, mostly noted in the epigastric region.  Musculoskeletal:        General: Normal range of motion.     Cervical back: Normal range of motion and neck supple.  Skin:    General: Skin is warm and dry.  Neurological:     General: No focal deficit present.     Mental Status: She is alert and oriented to person, place, and time.     (all labs ordered are listed, but only abnormal results are displayed) Labs Reviewed  CBC - Abnormal; Notable for the following components:      Result Value   WBC 14.4 (*)    All other components within normal limits  LIPASE, BLOOD  COMPREHENSIVE METABOLIC PANEL  WITH GFR  URINALYSIS, ROUTINE W REFLEX MICROSCOPIC    EKG: None  Radiology: No results found.   Procedures   Medications Ordered in the ED  sodium chloride  0.9 % bolus 500 mL (has no administration in time range)  ondansetron  (ZOFRAN ) injection 4 mg (has no administration in time range)                                    Medical Decision Making Amount and/or Complexity of Data Reviewed Labs: ordered. Radiology: ordered.  Risk Prescription drug management. Decision regarding hospitalization.   Patient is a 89 year old female presenting with nausea, vomiting, and abdominal pain.  This started suddenly just after going to bed this evening.  Patient arrives here with stable vital signs and is afebrile.  Physical examination reveals tenderness to the epigastric region, but no peritoneal signs.  Laboratory studies obtained including CBC, CMP, and lipase, all of which are basically unremarkable.  CT scan of the abdomen and pelvis obtained showing no acute intra-abdominal pathology.  Patient has received IV fluids along with Zofran  for nausea, but continues to vomit.  Patient appears weak and frail and I do not feel can return home in her condition.  I have spoken with the hospitalist who agrees to admit.  Because of her symptoms could be viral or possibly foodborne in nature.     Final diagnoses:  None    ED Discharge Orders     None          Geroldine Berg, MD 05/16/24 [phone]

## 2024-05-17 ENCOUNTER — Inpatient Hospital Stay (HOSPITAL_COMMUNITY)

## 2024-05-17 DIAGNOSIS — I213 ST elevation (STEMI) myocardial infarction of unspecified site: Secondary | ICD-10-CM | POA: Diagnosis not present

## 2024-05-17 DIAGNOSIS — Z7189 Other specified counseling: Secondary | ICD-10-CM | POA: Diagnosis not present

## 2024-05-17 DIAGNOSIS — K5904 Chronic idiopathic constipation: Secondary | ICD-10-CM | POA: Diagnosis not present

## 2024-05-17 DIAGNOSIS — R1013 Epigastric pain: Secondary | ICD-10-CM

## 2024-05-17 DIAGNOSIS — Z515 Encounter for palliative care: Secondary | ICD-10-CM

## 2024-05-17 DIAGNOSIS — I2510 Atherosclerotic heart disease of native coronary artery without angina pectoris: Secondary | ICD-10-CM

## 2024-05-17 DIAGNOSIS — J984 Other disorders of lung: Secondary | ICD-10-CM

## 2024-05-17 LAB — COMPREHENSIVE METABOLIC PANEL WITH GFR
ALT: 64 U/L — ABNORMAL HIGH (ref 0–44)
AST: 61 U/L — ABNORMAL HIGH (ref 15–41)
Albumin: 3.3 g/dL — ABNORMAL LOW (ref 3.5–5.0)
Alkaline Phosphatase: 104 U/L (ref 38–126)
Anion gap: 11 (ref 5–15)
BUN: 20 mg/dL (ref 8–23)
CO2: 18 mmol/L — ABNORMAL LOW (ref 22–32)
Calcium: 9.1 mg/dL (ref 8.9–10.3)
Chloride: 109 mmol/L (ref 98–111)
Creatinine, Ser: 0.65 mg/dL (ref 0.44–1.00)
GFR, Estimated: >60 mL/min (ref >60)
Glucose, Bld: 164 mg/dL — ABNORMAL HIGH (ref 70–99)
Potassium: 3.8 mmol/L (ref 3.5–5.1)
Sodium: 138 mmol/L (ref 135–145)
Total Bilirubin: 0.7 mg/dL (ref 0.0–1.2)
Total Protein: 6.3 g/dL — ABNORMAL LOW (ref 6.5–8.1)

## 2024-05-17 LAB — CBC
HCT: 44.7 % (ref 36.0–46.0)
Hemoglobin: 15.3 g/dL — ABNORMAL HIGH (ref 12.0–15.0)
MCH: 33.3 pg (ref 26.0–34.0)
MCHC: 34.2 g/dL (ref 30.0–36.0)
MCV: 97.2 fL (ref 80.0–100.0)
Platelets: 195 K/uL (ref 150–400)
RBC: 4.6 MIL/uL (ref 3.87–5.11)
RDW: 12.2 % (ref 11.5–15.5)
WBC: 26 K/uL — ABNORMAL HIGH (ref 4.0–10.5)
nRBC: 0 % (ref 0.0–0.2)

## 2024-05-17 LAB — MAGNESIUM: Magnesium: 1.8 mg/dL (ref 1.7–2.4)

## 2024-05-17 MED ORDER — GLYCOPYRROLATE 1 MG PO TABS: 1.0000 mg | ORAL_TABLET | ORAL | Status: DC | PRN

## 2024-05-17 MED ORDER — HALOPERIDOL LACTATE 2 MG/ML PO CONC: 0.5000 mg | ORAL | Status: DC | PRN

## 2024-05-17 MED ORDER — PANTOPRAZOLE SODIUM 40 MG IV SOLR
40.0000 mg | Freq: Every day | INTRAVENOUS | Status: DC
  Administered 2024-05-17: 40 mg via INTRAVENOUS
  Filled 2024-05-17 (×2): qty 10

## 2024-05-17 MED ORDER — HALOPERIDOL 0.5 MG PO TABS: 0.5000 mg | ORAL_TABLET | ORAL | Status: DC | PRN

## 2024-05-17 MED ORDER — DIAZEPAM 5 MG/ML IJ SOLN
2.5000 mg | INTRAMUSCULAR | Status: DC | PRN
  Administered 2024-05-18: 2.5 mg via INTRAVENOUS
  Filled 2024-05-17: qty 2

## 2024-05-17 MED ORDER — HYDROMORPHONE HCL 1 MG/ML IJ SOLN
0.2500 mg | INTRAMUSCULAR | Status: DC | PRN
  Administered 2024-05-17 – 2024-05-18 (×8): 0.25 mg via INTRAVENOUS
  Filled 2024-05-17 (×8): qty 0.5

## 2024-05-17 MED ORDER — HYDROMORPHONE HCL 1 MG/ML IJ SOLN
0.2500 mg | INTRAMUSCULAR | Status: DC | PRN
  Administered 2024-05-17: 0.25 mg via INTRAVENOUS
  Filled 2024-05-17: qty 0.5

## 2024-05-17 MED ORDER — HALOPERIDOL LACTATE 5 MG/ML IJ SOLN: 0.5000 mg | INTRAMUSCULAR | Status: DC | PRN

## 2024-05-17 MED ORDER — GLYCOPYRROLATE 0.2 MG/ML IJ SOLN: 0.2000 mg | INTRAMUSCULAR | Status: DC | PRN

## 2024-05-17 MED ORDER — LORAZEPAM 1 MG PO TABS
1.0000 mg | ORAL_TABLET | ORAL | Status: DC | PRN
Start: 2024-05-17 — End: 2024-05-18

## 2024-05-17 MED ORDER — HYDROMORPHONE HCL 1 MG/ML IJ SOLN: 0.2500 mg | INTRAMUSCULAR | Status: DC

## 2024-05-17 MED ORDER — POLYVINYL ALCOHOL 1.4 % OP SOLN
1.0000 [drp] | Freq: Four times a day (QID) | OPHTHALMIC | Status: DC | PRN
  Administered 2024-05-17: 1 [drp] via OPHTHALMIC
  Filled 2024-05-17: qty 15

## 2024-05-17 MED ORDER — GLYCOPYRROLATE 0.2 MG/ML IJ SOLN
0.2000 mg | INTRAMUSCULAR | Status: DC | PRN
  Administered 2024-05-17 – 2024-05-18 (×3): 0.2 mg via INTRAVENOUS
  Filled 2024-05-17 (×3): qty 1

## 2024-05-17 MED ORDER — LORAZEPAM 2 MG/ML PO CONC: 1.0000 mg | ORAL | Status: DC | PRN

## 2024-05-17 NOTE — Plan of Care (Signed)
  Problem: Activity: Goal: Risk for activity intolerance will decrease Outcome: Not Progressing   Problem: Nutrition: Goal: Adequate nutrition will be maintained Outcome: Not Progressing   

## 2024-05-17 NOTE — Progress Notes (Signed)
 PROGRESS NOTE    Christine Juarez  FMW:[id number] DOB: 1920/08/11 DOA: 05/16/2024 PCP: Shona Norleen PEDLAR, MD   Brief Narrative:    Christine Juarez is a 89 y.o. female with medical history significant of GERD, CAD status post CABG, hypothyroidism who presented to the emergency department due to severe abdominal pain.  Patient went to bed and then woke up with nausea vomiting and abdominal cramping.  It appears she may have some gastroenteritis, but is now hypothermic and has been started on [employer]  She became febrile overnight and it appears that she has aspirated and is now more tachypneic.  Now noted to have ST elevations on EKG as well as telemetry that are quite concerning.  Given patient's overall status and advanced age, she has now been transitioned to comfort care after further discussion between palliative care and daughter.  Very poor prognosis and anticipate in-hospital death.  Assessment & Plan:   Principal Problem:   Constipation Active Problems:   Hypothermia  Assessment and Plan:   Abdominal pain with nausea and vomiting likely secondary to acute gastroenteritis  Hypothermia now with fever likely related to aspiration pneumonia   Hypothyroidism-TSH 1.28   Hypertension   CAD with possible STEMI  Patient now transition to comfort measures with very poor prognosis and anticipated in hospital death.   DVT prophylaxis: None Code Status: DNR/comfort measures Family Communication: Daughter at bedside 7/31 Disposition Plan:  Status is: Inpatient Remains inpatient appropriate because: Need for IV fluids and increased intensity with IV antiemetic use.   Consultants:  Palliative care  Procedures:  None  Antimicrobials:  None   Subjective: Patient seen and evaluated today and overall appears to be in very poor condition with noted fever and tachypnea.  She is unresponsive and has been delirious since admission.  Daughter is at bedside and discussions were had  regarding what appears to be very poor prognosis and likely that patient is undergoing the active dying process.  EKG with noted ST elevations as well and patient has now been made comfort care.  Objective: Vitals:   05/17/24 1000 05/17/24 1100 05/17/24 1139 05/17/24 1200  BP: (!) 145/81 139/76  133/78  Pulse: 98 98  (!) 102  Resp: (!) 37 (!) 40  (!) 28  Temp:   100.2 F (37.9 C)   TempSrc:   Rectal   SpO2: 95% 95%  95%  Weight:      Height:        Intake/Output Summary (Last 24 hours) at 05/17/2024 1445 Last data filed at 05/17/2024 0400 Gross per 24 hour  Intake 1121.81 ml  Output 751 ml  Net 370.81 ml   Filed Weights   05/16/24 1024  Weight: 53.8 kg    Examination:  General exam: Appears obtunded, frail/elderly Respiratory system: Clear to auscultation. Respiratory effort normal.  Tachypneic Cardiovascular system: S1 & S2 heard, RRR.  Gastrointestinal system: Abdomen is soft, tender to palpation over epigastric region Central nervous system: Unresponsive Extremities: No edema Skin: No significant lesions noted    Data Reviewed: I have personally reviewed following labs and imaging studies  CBC: Recent Labs  Lab 05/16/24 0129 05/17/24 0320  WBC 14.4* 26.0*  HGB 14.7 15.3*  HCT 43.5 44.7  MCV 97.8 97.2  PLT 220 195   Basic Metabolic Panel: Recent Labs  Lab 05/16/24 0129 05/17/24 0320  NA 139 138  K 3.2* 3.8  CL 107 109  CO2 19* 18*  GLUCOSE 189* 164*  BUN 27* 20  CREATININE 0.88 0.65  CALCIUM  9.6 9.1  MG  --  1.8   GFR: Estimated Creatinine Clearance: 29.4 mL/min (by C-G formula based on SCr of 0.65 mg/dL). Liver Function Tests: Recent Labs  Lab 05/16/24 0129 05/17/24 0320  AST 22 61*  ALT 15 64*  ALKPHOS 102 104  BILITOT 0.7 0.7  PROT 6.7 6.3*  ALBUMIN 3.7 3.3*   Recent Labs  Lab 05/16/24 0129  LIPASE 39   No results for input(s): AMMONIA in the last 168 hours. Coagulation Profile: No results for input(s): INR, PROTIME in  the last 168 hours. Cardiac Enzymes: No results for input(s): CKTOTAL, CKMB, CKMBINDEX, TROPONINI in the last 168 hours. BNP (last 3 results) No results for input(s): PROBNP in the last 8760 hours. HbA1C: No results for input(s): HGBA1C in the last 72 hours. CBG: No results for input(s): GLUCAP in the last 168 hours. Lipid Profile: No results for input(s): CHOL, HDL, LDLCALC, TRIG, CHOLHDL, LDLDIRECT in the last 72 hours. Thyroid  Function Tests: Recent Labs    05/16/24 0129  TSH 1.278   Anemia Panel: No results for input(s): VITAMINB12, FOLATE, FERRITIN, TIBC, IRON, RETICCTPCT in the last 72 hours. Sepsis Labs: Recent Labs  Lab 05/16/24 1356  PROCALCITON 0.19    Recent Results (from the past 240 hours)  MRSA Next Gen by PCR, Nasal     Status: None   Collection Time: 05/16/24 10:20 AM   Specimen: Nasal Mucosa; Nasal Swab  Result Value Ref Range Status   MRSA by PCR Next Gen NOT DETECTED NOT DETECTED Final    Comment: (NOTE) The GeneXpert MRSA Assay (FDA approved for NASAL specimens only), is one component of a comprehensive MRSA colonization surveillance program. It is not intended to diagnose MRSA infection nor to guide or monitor treatment for MRSA infections. Test performance is not FDA approved in patients less than 65 years old. Performed at [facility], [address] [place], [place] [address]   Culture, blood (Routine X 2) w Reflex to ID Panel     Status: None (Preliminary result)   Collection Time: 05/16/24  1:56 PM   Specimen: BLOOD LEFT HAND  Result Value Ref Range Status   Specimen Description BLOOD LEFT HAND BOTTLES DRAWN AEROBIC ONLY  Final   Special Requests   Final    Blood Culture results may not be optimal due to an inadequate volume of blood received in culture bottles   Culture   Final    NO GROWTH < 24 HOURS Performed at [facility], [address], [place], [place] [address]    Report Status PENDING   Incomplete  Culture, blood (Routine X 2) w Reflex to ID Panel     Status: None (Preliminary result)   Collection Time: 05/16/24  1:56 PM   Specimen: BLOOD LEFT HAND  Result Value Ref Range Status   Specimen Description BLOOD LEFT HAND BOTTLES DRAWN AEROBIC ONLY  Final   Special Requests   Final    Blood Culture results may not be optimal due to an inadequate volume of blood received in culture bottles   Culture   Final    NO GROWTH < 24 HOURS Performed at [facility], [address], [place], [place] [address]    Report Status PENDING  Incomplete         Radiology Studies: DG CHEST PORT 1 VIEW Result Date: 05/17/2024 CLINICAL DATA:  10027 Tachypnea 10027. EXAM: PORTABLE CHEST 1 VIEW COMPARISON:  07/20/2021. FINDINGS: There is new focal approximately  1.5 x 2.0 cm opacity overlying the right lower lung zone, which in appropriate clinical settings may represent focal pneumonitis. There is also left retrocardiac opacity obscuring the left hemidiaphragm and descending thoracic aorta, which may represent combination of left lung lower lobe atelectasis and/or consolidation. No significant pleural effusion noted on either side. No pneumothorax. Stable cardio-mediastinal silhouette. There are surgical staples along the heart border and sternotomy wires, status post CABG (coronary artery bypass graft). No acute osseous abnormalities. The soft tissues are within normal limits. IMPRESSION: New focal opacity overlying the right lower lung zone, which in appropriate clinical settings may represent focal pneumonitis. There is also left retrocardiac opacity obscuring the left hemidiaphragm and descending thoracic aorta, which may represent combination of left lung lower lobe atelectasis and/or consolidation. Follow-up to clearing is recommended. Electronically Signed   By: Ree Molt M.D.   On: 05/17/2024 11:42   CT ABDOMEN PELVIS W CONTRAST Result Date: 05/16/2024 CLINICAL DATA:  Acute nonlocalized  abdominal pain, nausea, diarrhea EXAM: CT ABDOMEN AND PELVIS WITH CONTRAST TECHNIQUE: Multidetector CT imaging of the abdomen and pelvis was performed using the standard protocol following bolus administration of intravenous contrast. RADIATION DOSE REDUCTION: This exam was performed according to the departmental dose-optimization program which includes automated exposure control, adjustment of the mA and/or kV according to patient size and/or use of iterative reconstruction technique. CONTRAST:  OMNIPAQUE  IOHEXOL  300 MG/ML  SOLN COMPARISON:  None Available. FINDINGS: Lower chest: Median sternotomy has been performed. Extensive calcification the aortic valve leaflets. Extensive right coronary artery calcification. Global cardiac size is at the upper limits of normal. Small hiatal hernia. Hepatobiliary: No focal liver abnormality is seen. Status post cholecystectomy. No biliary dilatation. Pancreas: Unremarkable Spleen: 17 mm probable splenic hemangioma (13/3). Spleen is otherwise unremarkable. Adrenals/Urinary Tract: Adrenal glands are unremarkable. Kidneys are normal, without renal calculi, focal lesion, or hydronephrosis. Bladder is unremarkable. Stomach/Bowel: Moderate colonic stool burden. Severe sigmoid diverticulosis. Stomach, small bowel, and large bowel are otherwise unremarkable. Appendix absent. No evidence of obstruction or focal inflammation. No free intraperitoneal gas or fluid. Vascular/Lymphatic: Aortic atherosclerosis. No enlarged abdominal or pelvic lymph nodes. Reproductive: Uterus and bilateral adnexa are unremarkable. Other: Tiny fat containing left inguinal hernia Musculoskeletal: Osseous structures are diffusely osteopenic. Grade 1 anterolisthesis L3-4. Osseous structures are otherwise age-appropriate. No acute bone abnormality. IMPRESSION: 1. No acute intra-abdominal pathology identified. No definite explanation for the patient's reported symptoms. 2. Extensive calcification of the  aortic valve leaflets. Echocardiography may be helpful for further evaluation. 3. Extensive right coronary artery calcification. 4. Small hiatal hernia. 5. Severe sigmoid diverticulosis. 6. Moderate colonic stool burden Aortic Atherosclerosis (ICD10-I70.0). Electronically Signed   By: Dorethia Molt M.D.   On: 05/16/2024 03:01        Scheduled Meds:  Chlorhexidine  Gluconate Cloth  6 each Topical Q0600   pantoprazole  (PROTONIX ) IV  40 mg Intravenous Daily     LOS: 1 day    Time spent: 55 minutes    Jacqualine Weichel JONETTA Fairly, DO [facility]  If 7PM-7AM, please contact night-coverage [email or web address] 05/17/2024, 2:45 PM

## 2024-05-17 NOTE — Progress Notes (Signed)
 Patient very lethargic. Difficult to arouse even with painful stimuli. Unable to safely administer PO medications at this time.

## 2024-05-17 NOTE — Progress Notes (Signed)
   05/17/24 1300  Spiritual Encounters  Type of Visit Initial  Care provided to: Pt and family  Referral source Other (comment) (Spiritual consult)  Reason for visit Urgent spiritual support  OnCall Visit No   Chaplain responded to a spiritual consult for prayer. I met with with the family of the patient, Christine Juarez, who were supporting her with their presence. Family participated in a life review of [name] including that she was a very busy individual who loved to play games. We took a few minutes to pray for [name] and for their well-being.   Carley Birmingham [facility]  [phone]

## 2024-05-17 NOTE — Consult Note (Signed)
 Consultation Note Date: 05/17/2024   Patient Name: Christine Juarez  DOB: 11-Jun-1920  MRN: [id number]  Age / Sex: 89 y.o., female  PCP: Shona Norleen PEDLAR, MD Referring Physician: Maree Adron BIRCH, DO  Reason for Consultation:  goals of care, looks as though she may be actively dying  HPI/Patient Profile: 89 y.o. female  with past medical history of CAD, s/p CABG, GERD, hypothyroidism admitted on 05/16/2024 with abdominal pain, nausea, vomiting and diarrhea. She was initially hypothermic, today is hyperthermic with fever 101. Palliative medicine consulted for GOC.    Primary Decision Maker NEXT OF KIN daughter Christine Juarez  Discussion: Chart reviewed including labs, progress notes, imaging from this and previous encounters.  Labs reviewed- mild hypokalemia at 3.2. WBC elevated at 26 today. UA with spilling glucose. Albumin 3.3, AST/ALT elevated. Blood cultures with no growth. CT CAP reviewed- extensive coronary calcifications, moderate colonic stool burden.   She has received antibiotics.  Today she is unresponsive and with elevated respiration rate.  I met with Dr. Maree and patient's daughter Christine at bedside. Dr. Maree reviewed patient's significant acute status - concern for infection, and her declining state not responding to current interventions. Shared concern that Christine Juarez might not survive this hospitalization. He shared plan to review chest xray for possible pneumonia, changing antibiotics, bipap if needed.  I spoke further with Christine. 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 Very special to her family and her community.  There has been some decline in the last several weeks in her functional, nutritional, and cognitive status. She had stopped eating. She was sleeping more. She was not as interactive as she previously was.  We  discussed [name]'s grave situation and possible poor prognosis. We discussed that Christine Juarez would not want her life to be prolonged artificially, she would not want to suffer at end of life. I reviewed with her that bipap can be very uncomfortable and I worry that given Christine Juarez's current state that bipap would only prolong her dying process and would not reverse her situation. Christine agreed. Plan made to wait for chest xray results and discuss further. I also reviewed with her what a transition to comfort measures would look like. Discussed transition to comfort measures only which includes stopping IV fluids, antibiotics, labs and providing symptom management for SOB, anxiety, nausea, vomiting, and other symptoms of dying.   Addendum:  I returned to bedside due to being notified by nurse there was evidence of ST elevation on monitor. 12 lead EKG was performed, I reviewed and confirmed with Dr. Maree that EKG indicated STEMI. I discussed with Dr. Maree- given patient's frailty and poor condition there are no interventions that can be done- comfort measures recommended.  I met with Christine again and reviewed findings of EKG. Chest xray shows some inflammation- possibly from aspiration- but is not significant enough to explain her current symptoms- more likely her presentation is due to STEMI. I discussed transition to  comfort with Christine and she is in agreement. I encouraged her to reach out to family and have them come sooner than later.     SUMMARY OF RECOMMENDATIONS -STEMI - unfortunately due to patient's frail state she could not undergo intervention such as cardiac cath, etc- recommendations for comfort measures and family is in agreement -Hydromorphone  0.25mg  IV q15 min prn for increased RR, air hunger, any sign of discomfort- avoid morphine  as patient has had paradoxical reaction in the past -Diazepam  2.5mg  IV q4hr for anxiety or agitation -Robinul  0.2mg  IV q4hr for terminal secretions -Protonix  IV 40mg   daily for GERD -Stop interventions and medications that are not contributing to comfort      Code Status/Advance Care Planning:   Code Status: Limited: Do not attempt resuscitation (DNR) -DNR-LIMITED -Do Not Intubate/DNI     Prognosis:   Hours - Days  Discharge Planning: Anticipated Hospital Death  Primary Diagnoses: Present on Admission:  Constipation  Hypothermia   Review of Systems  Unable to perform ROS: Mental status change    Physical Exam Vitals and nursing note reviewed.  Constitutional:      Appearance: She is ill-appearing.  Cardiovascular:     Rate and Rhythm: Normal rate.  Pulmonary:     Comments: Increased effort and rate, using accessory muscles Skin:    General: Skin is warm and dry.  Neurological:     Comments: unresponsive     Vital Signs: BP (!) 145/81   Pulse 98   Temp 100 F (37.8 C) (Rectal)   Resp (!) 37   Ht 5' 3 (1.6 m)   Wt 53.8 kg   SpO2 95%   BMI 21.01 kg/m  Pain Scale: CPOT   Pain Score: 3    SpO2: SpO2: 95 % O2 Device:SpO2: 95 % O2 Flow Rate: .   IO: Intake/output summary:  Intake/Output Summary (Last 24 hours) at 05/17/2024 1037 Last data filed at 05/17/2024 0400 Gross per 24 hour  Intake 1121.81 ml  Output 751 ml  Net 370.81 ml    LBM: Last BM Date : 05/14/24 Baseline Weight: Weight: 53.8 kg Most recent weight: Weight: 53.8 kg       Thank you for this consult. Palliative medicine will continue to follow and assist as needed.  Time Total: 120 minutes Signed by: Cassondra Stain, AGNP-C Palliative Medicine  Time includes:   Preparing to see the patient (e.g., review of tests) Obtaining and/or reviewing separately obtained history Performing a medically necessary appropriate examination and/or evaluation Counseling and educating the patient/family/caregiver Ordering medications, tests, or procedures Referring and communicating with other health care professionals (when not reported separately) Documenting  clinical information in the electronic or other health record Independently interpreting results (not reported separately) and communicating results to the patient/family/caregiver Care coordination (not reported separately) Clinical documentation   Please contact Palliative Medicine Team phone at [phone] for questions and concerns.  For individual provider: See Tracey

## 2024-05-17 NOTE — Plan of Care (Signed)
   Problem: Coping: Goal: Level of anxiety will decrease Outcome: Progressing   Problem: Elimination: Goal: Will not experience complications related to urinary retention Outcome: Progressing   Problem: Pain Managment: Goal: General experience of comfort will improve and/or be controlled Outcome: Progressing

## 2024-05-18 ENCOUNTER — Encounter (HOSPITAL_COMMUNITY): Payer: Self-pay | Admitting: Internal Medicine

## 2024-05-18 DIAGNOSIS — K5904 Chronic idiopathic constipation: Secondary | ICD-10-CM | POA: Diagnosis not present

## 2024-05-18 DEATH — deceased

## 2024-05-21 LAB — CULTURE, BLOOD (ROUTINE X 2)
Culture: NO GROWTH
Culture: NO GROWTH

## 2024-06-18 NOTE — Plan of Care (Signed)
   Problem: Education: Goal: Knowledge of General Education information will improve Description Including pain rating scale, medication(s)/side effects and non-pharmacologic comfort measures Outcome: Progressing   Problem: Health Behavior/Discharge Planning: Goal: Ability to manage health-related needs will improve Outcome: Progressing

## 2024-06-18 NOTE — Progress Notes (Signed)
 Called into the room by family and 2 RN's validated no breathing and no pulse, at 0905.

## 2024-06-18 NOTE — Death Summary Note (Signed)
 DEATH SUMMARY   Patient Details  Name: Christine Juarez MRN: [id number] DOB: 01/25/1920 ERE:Yjoo, Christine PEDLAR, MD Admission/Discharge Information   Admit Date:  June 06, 2024  Date of Death: Date of Death: 2024-06-08  Time of Death: Time of Death: 0905  Length of Stay: 2   Principle Cause of death: Sepsis due to aspiration pneumonia in the setting of gastroenteritis along with concern for ACS/STEMI  Hospital Diagnoses: Principal Problem:   Constipation Active Problems:   Hypothermia   Hospital Course: No notes on file  Assessment and Plan:  [name] is a 89 y.o. female with medical history significant of GERD, CAD status post CABG, hypothyroidism who presented to the emergency department due to severe abdominal pain.  Patient went to bed and then woke up with nausea vomiting and abdominal cramping.  It appears she may have some gastroenteritis, but is now hypothermic and has been started on [employer]  She became febrile overnight and it appears that she has aspirated and is now more tachypneic.  Now noted to have ST elevations on EKG as well as telemetry that are quite concerning.  Given patient's overall status and advanced age, she was transitioned to comfort care after further discussion between palliative care and daughter on 7/31.  She expired 0905 on 06-08-24.   Procedures: None  Consultations: Palliative care  The results of significant diagnostics from this hospitalization (including imaging, microbiology, ancillary and laboratory) are listed below for reference.   Significant Diagnostic Studies: DG CHEST PORT 1 VIEW Result Date: 05/17/2024 CLINICAL DATA:  10027 Tachypnea [id number]. EXAM: PORTABLE CHEST 1 VIEW COMPARISON:  07/20/2021. FINDINGS: There is new focal approximately 1.5 x 2.0 cm opacity overlying the right lower lung zone, which in appropriate clinical settings may represent focal pneumonitis. There is also left retrocardiac opacity obscuring the left hemidiaphragm and  descending thoracic aorta, which may represent combination of left lung lower lobe atelectasis and/or consolidation. No significant pleural effusion noted on either side. No pneumothorax. Stable cardio-mediastinal silhouette. There are surgical staples along the heart border and sternotomy wires, status post CABG (coronary artery bypass graft). No acute osseous abnormalities. The soft tissues are within normal limits. IMPRESSION: New focal opacity overlying the right lower lung zone, which in appropriate clinical settings may represent focal pneumonitis. There is also left retrocardiac opacity obscuring the left hemidiaphragm and descending thoracic aorta, which may represent combination of left lung lower lobe atelectasis and/or consolidation. Follow-up to clearing is recommended. Electronically Signed   By: Ree Molt M.D.   On: 05/17/2024 11:42   CT ABDOMEN PELVIS W CONTRAST Result Date: 06-06-24 CLINICAL DATA:  Acute nonlocalized abdominal pain, nausea, diarrhea EXAM: CT ABDOMEN AND PELVIS WITH CONTRAST TECHNIQUE: Multidetector CT imaging of the abdomen and pelvis was performed using the standard protocol following bolus administration of intravenous contrast. RADIATION DOSE REDUCTION: This exam was performed according to the departmental dose-optimization program which includes automated exposure control, adjustment of the mA and/or kV according to patient size and/or use of iterative reconstruction technique. CONTRAST:  OMNIPAQUE  IOHEXOL  300 MG/ML  SOLN COMPARISON:  None Available. FINDINGS: Lower chest: Median sternotomy has been performed. Extensive calcification the aortic valve leaflets. Extensive right coronary artery calcification. Global cardiac size is at the upper limits of normal. Small hiatal hernia. Hepatobiliary: No focal liver abnormality is seen. Status post cholecystectomy. No biliary dilatation. Pancreas: Unremarkable Spleen: 17 mm probable splenic hemangioma (13/3). Spleen is  otherwise unremarkable. Adrenals/Urinary Tract: Adrenal glands are unremarkable. Kidneys are normal,  without renal calculi, focal lesion, or hydronephrosis. Bladder is unremarkable. Stomach/Bowel: Moderate colonic stool burden. Severe sigmoid diverticulosis. Stomach, small bowel, and large bowel are otherwise unremarkable. Appendix absent. No evidence of obstruction or focal inflammation. No free intraperitoneal gas or fluid. Vascular/Lymphatic: Aortic atherosclerosis. No enlarged abdominal or pelvic lymph nodes. Reproductive: Uterus and bilateral adnexa are unremarkable. Other: Tiny fat containing left inguinal hernia Musculoskeletal: Osseous structures are diffusely osteopenic. Grade 1 anterolisthesis L3-4. Osseous structures are otherwise age-appropriate. No acute bone abnormality. IMPRESSION: 1. No acute intra-abdominal pathology identified. No definite explanation for the patient's reported symptoms. 2. Extensive calcification of the aortic valve leaflets. Echocardiography may be helpful for further evaluation. 3. Extensive right coronary artery calcification. 4. Small hiatal hernia. 5. Severe sigmoid diverticulosis. 6. Moderate colonic stool burden Aortic Atherosclerosis (ICD10-I70.0). Electronically Signed   By: Dorethia Molt M.D.   On: 05/16/2024 03:01    Microbiology: Recent Results (from the past 240 hours)  MRSA Next Gen by PCR, Nasal     Status: None   Collection Time: 05/16/24 10:20 AM   Specimen: Nasal Mucosa; Nasal Swab  Result Value Ref Range Status   MRSA by PCR Next Gen NOT DETECTED NOT DETECTED Final    Comment: (NOTE) The GeneXpert MRSA Assay (FDA approved for NASAL specimens only), is one component of a comprehensive MRSA colonization surveillance program. It is not intended to diagnose MRSA infection nor to guide or monitor treatment for MRSA infections. Test performance is not FDA approved in patients less than 44 years old. Performed at [facility], [address],  [place], [place] [address]   Culture, blood (Routine X 2) w Reflex to ID Panel     Status: None (Preliminary result)   Collection Time: 05/16/24  1:56 PM   Specimen: BLOOD LEFT HAND  Result Value Ref Range Status   Specimen Description BLOOD LEFT HAND BOTTLES DRAWN AEROBIC ONLY  Final   Special Requests   Final    Blood Culture results may not be optimal due to an inadequate volume of blood received in culture bottles   Culture   Final    NO GROWTH 2 DAYS Performed at [facility], [address], [place], [place] [address]    Report Status PENDING  Incomplete  Culture, blood (Routine X 2) w Reflex to ID Panel     Status: None (Preliminary result)   Collection Time: 05/16/24  1:56 PM   Specimen: BLOOD LEFT HAND  Result Value Ref Range Status   Specimen Description BLOOD LEFT HAND BOTTLES DRAWN AEROBIC ONLY  Final   Special Requests   Final    Blood Culture results may not be optimal due to an inadequate volume of blood received in culture bottles   Culture   Final    NO GROWTH 2 DAYS Performed at [facility], [address], [place], [place] [address]    Report Status PENDING  Incomplete    Time spent: 35 minutes  Signed: Adron JONETTA Fairly, DO 06-09-24

## 2024-06-18 DEATH — deceased
